# Patient Record
Sex: Female | Born: 1980 | Race: Black or African American | Hispanic: No | Marital: Single | Smoking: Never smoker
Health system: Southern US, Community
[De-identification: ages and names within clinical notes are randomized; demographics above are authoritative.]

## PROBLEM LIST (undated history)

## (undated) DIAGNOSIS — I10 Essential (primary) hypertension: Secondary | ICD-10-CM

## (undated) DIAGNOSIS — G43909 Migraine, unspecified, not intractable, without status migrainosus: Secondary | ICD-10-CM

## (undated) DIAGNOSIS — A4902 Methicillin resistant Staphylococcus aureus infection, unspecified site: Secondary | ICD-10-CM

## (undated) DIAGNOSIS — K209 Esophagitis, unspecified without bleeding: Secondary | ICD-10-CM

## (undated) DIAGNOSIS — C801 Malignant (primary) neoplasm, unspecified: Secondary | ICD-10-CM

## (undated) DIAGNOSIS — J189 Pneumonia, unspecified organism: Secondary | ICD-10-CM

## (undated) DIAGNOSIS — G8929 Other chronic pain: Secondary | ICD-10-CM

## (undated) DIAGNOSIS — R079 Chest pain, unspecified: Secondary | ICD-10-CM

## (undated) DIAGNOSIS — D649 Anemia, unspecified: Secondary | ICD-10-CM

## (undated) DIAGNOSIS — N289 Disorder of kidney and ureter, unspecified: Secondary | ICD-10-CM

## (undated) HISTORY — PX: MASTECTOMY: SHX3

## (undated) HISTORY — PX: CHOLECYSTECTOMY: SHX55

---

## 2006-10-23 ENCOUNTER — Emergency Department (HOSPITAL_COMMUNITY): Admission: EM | Admit: 2006-10-23 | Discharge: 2006-10-23 | Payer: Self-pay | Admitting: Emergency Medicine

## 2008-12-10 ENCOUNTER — Emergency Department (HOSPITAL_BASED_OUTPATIENT_CLINIC_OR_DEPARTMENT_OTHER): Admission: EM | Admit: 2008-12-10 | Discharge: 2008-12-10 | Payer: Self-pay | Admitting: Emergency Medicine

## 2008-12-12 ENCOUNTER — Emergency Department (HOSPITAL_BASED_OUTPATIENT_CLINIC_OR_DEPARTMENT_OTHER): Admission: EM | Admit: 2008-12-12 | Discharge: 2008-12-13 | Payer: Self-pay | Admitting: Emergency Medicine

## 2008-12-26 ENCOUNTER — Emergency Department (HOSPITAL_BASED_OUTPATIENT_CLINIC_OR_DEPARTMENT_OTHER): Admission: EM | Admit: 2008-12-26 | Discharge: 2008-12-26 | Payer: Self-pay | Admitting: Emergency Medicine

## 2008-12-26 ENCOUNTER — Ambulatory Visit: Payer: Self-pay | Admitting: Diagnostic Radiology

## 2009-05-20 ENCOUNTER — Emergency Department (HOSPITAL_BASED_OUTPATIENT_CLINIC_OR_DEPARTMENT_OTHER): Admission: EM | Admit: 2009-05-20 | Discharge: 2009-05-20 | Payer: Self-pay | Admitting: Emergency Medicine

## 2009-09-21 ENCOUNTER — Emergency Department (HOSPITAL_BASED_OUTPATIENT_CLINIC_OR_DEPARTMENT_OTHER): Admission: EM | Admit: 2009-09-21 | Discharge: 2009-09-21 | Payer: Self-pay | Admitting: Emergency Medicine

## 2009-12-23 ENCOUNTER — Ambulatory Visit: Payer: Self-pay | Admitting: Obstetrics and Gynecology

## 2010-06-27 LAB — URINALYSIS, ROUTINE W REFLEX MICROSCOPIC
Bilirubin Urine: NEGATIVE
Glucose, UA: NEGATIVE mg/dL
Hgb urine dipstick: NEGATIVE
Nitrite: NEGATIVE
Specific Gravity, Urine: 1.005 (ref 1.005–1.030)
pH: 6 (ref 5.0–8.0)

## 2010-06-27 LAB — URINE MICROSCOPIC-ADD ON

## 2010-07-15 LAB — DIFFERENTIAL
Basophils Absolute: 0.1 10*3/uL (ref 0.0–0.1)
Basophils Absolute: 0.2 10*3/uL — ABNORMAL HIGH (ref 0.0–0.1)
Basophils Relative: 2 % — ABNORMAL HIGH (ref 0–1)
Basophils Relative: 3 % — ABNORMAL HIGH (ref 0–1)
Eosinophils Absolute: 0.1 10*3/uL (ref 0.0–0.7)
Eosinophils Relative: 1 % (ref 0–5)
Lymphocytes Relative: 19 % (ref 12–46)
Lymphocytes Relative: 25 % (ref 12–46)
Lymphocytes Relative: 31 % (ref 12–46)
Lymphs Abs: 1.8 10*3/uL (ref 0.7–4.0)
Monocytes Absolute: 0.7 10*3/uL (ref 0.1–1.0)
Monocytes Relative: 12 % (ref 3–12)
Neutro Abs: 5.7 10*3/uL (ref 1.7–7.7)
Neutro Abs: 3.1 10*3/uL (ref 1.7–7.7)

## 2010-07-15 LAB — BASIC METABOLIC PANEL
BUN: 2 mg/dL — ABNORMAL LOW (ref 6–23)
CO2: 25 mEq/L (ref 19–32)
GFR calc non Af Amer: >60 mL/min (ref >60)
Glucose, Bld: 86 mg/dL (ref 70–99)
Potassium: 3.4 mEq/L — ABNORMAL LOW (ref 3.5–5.1)

## 2010-07-15 LAB — URINALYSIS, ROUTINE W REFLEX MICROSCOPIC
Glucose, UA: NEGATIVE mg/dL
Glucose, UA: NEGATIVE mg/dL
Hgb urine dipstick: NEGATIVE
Hgb urine dipstick: NEGATIVE
Nitrite: NEGATIVE
Protein, ur: NEGATIVE mg/dL
Specific Gravity, Urine: 1.015 (ref 1.005–1.030)
Specific Gravity, Urine: 1.023 (ref 1.005–1.030)
Urobilinogen, UA: 1 mg/dL (ref 0.0–1.0)
pH: 7 (ref 5.0–8.0)

## 2010-07-15 LAB — CBC
HCT: 33.9 % — ABNORMAL LOW (ref 36.0–46.0)
HCT: 34.6 % — ABNORMAL LOW (ref 36.0–46.0)
Hemoglobin: 11.2 g/dL — ABNORMAL LOW (ref 12.0–15.0)
MCHC: 34.2 g/dL (ref 30.0–36.0)
Platelets: 215 10*3/uL (ref 150–400)
Platelets: 179 10*3/uL (ref 150–400)
Platelets: 222 10*3/uL (ref 150–400)
RDW: 14.3 % (ref 11.5–15.5)
RDW: 14.5 % (ref 11.5–15.5)
RDW: 14.6 % (ref 11.5–15.5)

## 2010-07-15 LAB — COMPREHENSIVE METABOLIC PANEL
Albumin: 3.9 g/dL (ref 3.5–5.2)
Alkaline Phosphatase: 66 U/L (ref 39–117)
BUN: 5 mg/dL — ABNORMAL LOW (ref 6–23)
Calcium: 9.1 mg/dL (ref 8.4–10.5)
Potassium: 3.7 mEq/L (ref 3.5–5.1)
Sodium: 139 mEq/L (ref 135–145)
Total Protein: 7.8 g/dL (ref 6.0–8.3)

## 2010-07-15 LAB — GC/CHLAMYDIA PROBE AMP, GENITAL: GC Probe Amp, Genital: NEGATIVE

## 2010-07-15 LAB — URINE MICROSCOPIC-ADD ON

## 2010-07-15 LAB — URINE CULTURE
Colony Count: NO GROWTH
Culture: NO GROWTH
Culture: NO GROWTH

## 2010-07-15 LAB — HCG, QUANTITATIVE, PREGNANCY: hCG, Beta Chain, Quant, S: 3141 m[IU]/mL — ABNORMAL HIGH (ref <5)

## 2010-07-15 LAB — WET PREP, GENITAL
Trich, Wet Prep: NONE SEEN
Yeast Wet Prep HPF POC: NONE SEEN

## 2011-12-23 ENCOUNTER — Emergency Department (HOSPITAL_BASED_OUTPATIENT_CLINIC_OR_DEPARTMENT_OTHER)
Admission: EM | Admit: 2011-12-23 | Discharge: 2011-12-23 | Disposition: A | Discharge status: Home / Self Care | Payer: Medicare Other | Attending: Emergency Medicine | Admitting: Emergency Medicine

## 2011-12-23 ENCOUNTER — Encounter (HOSPITAL_BASED_OUTPATIENT_CLINIC_OR_DEPARTMENT_OTHER): Payer: Self-pay | Admitting: *Deleted

## 2011-12-23 DIAGNOSIS — G43909 Migraine, unspecified, not intractable, without status migrainosus: Secondary | ICD-10-CM

## 2011-12-23 HISTORY — DX: Migraine, unspecified, not intractable, without status migrainosus: G43.909

## 2011-12-23 LAB — URINALYSIS, ROUTINE W REFLEX MICROSCOPIC
Bilirubin Urine: NEGATIVE
Ketones, ur: NEGATIVE mg/dL
Leukocytes, UA: NEGATIVE
Nitrite: NEGATIVE
Protein, ur: NEGATIVE mg/dL
Urobilinogen, UA: 1 mg/dL (ref 0.0–1.0)
pH: 6.5 (ref 5.0–8.0)

## 2011-12-23 MED ORDER — DIPHENHYDRAMINE HCL 50 MG/ML IJ SOLN
25.0000 mg | Freq: Once | INTRAMUSCULAR | Status: AC
  Administered 2011-12-23: 25 mg via INTRAVENOUS
  Filled 2011-12-23: qty 1

## 2011-12-23 MED ORDER — SODIUM CHLORIDE 0.9 % IV SOLN: INTRAVENOUS | Status: DC

## 2011-12-23 MED ORDER — SODIUM CHLORIDE 0.9 % IV BOLUS (SEPSIS)
1000.0000 mL | Freq: Once | INTRAVENOUS | Status: AC
  Administered 2011-12-23: 1000 mL via INTRAVENOUS

## 2011-12-23 MED ORDER — PROMETHAZINE HCL 25 MG/ML IJ SOLN
12.5000 mg | Freq: Once | INTRAMUSCULAR | Status: AC
  Administered 2011-12-23: 12.5 mg via INTRAVENOUS
  Filled 2011-12-23: qty 1

## 2011-12-23 MED ORDER — DEXAMETHASONE SODIUM PHOSPHATE 10 MG/ML IJ SOLN
10.0000 mg | Freq: Once | INTRAMUSCULAR | Status: AC
  Administered 2011-12-23: 10 mg via INTRAVENOUS
  Filled 2011-12-23: qty 1

## 2011-12-23 MED ORDER — HYDROMORPHONE HCL PF 1 MG/ML IJ SOLN
1.0000 mg | Freq: Once | INTRAMUSCULAR | Status: AC
  Administered 2011-12-23: 1 mg via INTRAVENOUS
  Filled 2011-12-23: qty 1

## 2011-12-23 NOTE — ED Notes (Signed)
Migraine x 3 days. Also c/o body aches and sore throat since yesterday

## 2011-12-23 NOTE — ED Provider Notes (Signed)
History     CSN: [id number]  Arrival date & time 12/23/11  1321   First MD Initiated Contact with Patient 12/23/11 1330      Chief Complaint  Patient presents with  . Migraine    (Consider location/radiation/quality/duration/timing/severity/associated sxs/prior treatment) Patient is a 31 y.o. female presenting with migraines. The history is provided by the patient.  Migraine This is a new problem. The current episode started more than 2 days ago. The problem occurs constantly. The problem has been gradually worsening. Associated symptoms include headaches. Pertinent negatives include no chest pain, no abdominal pain and no shortness of breath. Nothing aggravates the symptoms. Nothing relieves the symptoms.   Patient with history of migraines this migraine is typical for being predominantly right-sided around her right eye. And progressively getting worse over the past 3 days preterm is 10 out of 10. No significant nausea or vomiting associated with no focal deficits. Does have some mild photophobia. Pain is nonradiating. Patient also has some bodyaches and sore throat that started yesterday no nasal congestion. No fever.  Past Medical History  Diagnosis Date  . Migraines     Past Surgical History  Procedure Date  . Cholecystectomy     History reviewed. No pertinent family history.  History  Substance Use Topics  . Smoking status: Never Smoker   . Smokeless tobacco: Not on file  . Alcohol Use: No    OB History    Grav Para Term Preterm Abortions TAB SAB Ect Mult Living                  Review of Systems  Constitutional: Negative for fever.  HENT: Positive for sore throat. Negative for congestion and neck pain.   Eyes: Positive for pain.  Respiratory: Negative for shortness of breath.   Cardiovascular: Negative for chest pain.  Gastrointestinal: Negative for nausea, vomiting and abdominal pain.  Genitourinary: Negative for dysuria.  Musculoskeletal: Negative for  back pain.  Skin: Negative for rash.  Neurological: Positive for headaches.  Hematological: Does not bruise/bleed easily.    Allergies  Percocet  Home Medications  No current outpatient prescriptions on file.  BP 139/94  Pulse 94  Temp 97.5 F (36.4 C) (Oral)  Resp 18  Ht 5\' 7"  (1.702 m)  Wt 220 lb (99.791 kg)  BMI 34.46 kg/m2  SpO2 98%  Physical Exam  Nursing note and vitals reviewed. Constitutional: She is oriented to person, place, and time. She appears well-developed and well-nourished.  HENT:  Head: Normocephalic and atraumatic.  Mouth/Throat: Oropharynx is clear and moist.  Eyes: Conjunctivae normal and EOM are normal. Pupils are equal, round, and reactive to light.  Neck: Normal range of motion. Neck supple.  Cardiovascular: Normal rate, regular rhythm and normal heart sounds.   Pulmonary/Chest: Effort normal and breath sounds normal.  Abdominal: Soft. Bowel sounds are normal.  Musculoskeletal: Normal range of motion.  Neurological: She is alert and oriented to person, place, and time. No cranial nerve deficit. She exhibits normal muscle tone. Coordination normal.  Skin: Skin is warm. No rash noted.    ED Course  Procedures (including critical care time)   Labs Reviewed  URINALYSIS, ROUTINE W REFLEX MICROSCOPIC  PREGNANCY, URINE   No results found.   1. Migraine       MDM  Patient with history of migraines in the past. This migraine is typical for her headaches it is predominantly right-sided around the right eye. Been going on for 3 days so may be  exacerbated probably what sounds like a viral upper respiratory infection.  Patient given now migraine cocktail which included Decadron Benadryl and Phenergan and just a touch of the hydromorphone headache is significantly improved along with 1 L of IV fluids. Patient had been on a chronic medicine she doesn't know the name of it tried to look up in the system to see she been seen before even under a  different name with effusion to spell her name was unable to find any previous visit for her date of birth and name. Patient is comfortable and can be discharged home.        Shelda Jakes, MD 12/23/11 [phone]

## 2012-01-20 ENCOUNTER — Emergency Department (HOSPITAL_BASED_OUTPATIENT_CLINIC_OR_DEPARTMENT_OTHER)
Admission: EM | Admit: 2012-01-20 | Discharge: 2012-01-20 | Disposition: A | Discharge status: Home / Self Care | Payer: Medicare Other | Attending: Emergency Medicine | Admitting: Emergency Medicine

## 2012-01-20 ENCOUNTER — Encounter (HOSPITAL_BASED_OUTPATIENT_CLINIC_OR_DEPARTMENT_OTHER): Payer: Self-pay | Admitting: *Deleted

## 2012-01-20 DIAGNOSIS — R51 Headache: Secondary | ICD-10-CM

## 2012-01-20 DIAGNOSIS — G43909 Migraine, unspecified, not intractable, without status migrainosus: Secondary | ICD-10-CM | POA: Insufficient documentation

## 2012-01-20 DIAGNOSIS — Z885 Allergy status to narcotic agent status: Secondary | ICD-10-CM | POA: Insufficient documentation

## 2012-01-20 MED ORDER — SODIUM CHLORIDE 0.9 % IV SOLN: 1000.0000 mL | INTRAVENOUS | Status: DC

## 2012-01-20 MED ORDER — KETOROLAC TROMETHAMINE 30 MG/ML IJ SOLN
30.0000 mg | Freq: Once | INTRAMUSCULAR | Status: AC
  Administered 2012-01-20: 30 mg via INTRAVENOUS
  Filled 2012-01-20: qty 1

## 2012-01-20 MED ORDER — DIPHENHYDRAMINE HCL 50 MG/ML IJ SOLN
25.0000 mg | Freq: Once | INTRAMUSCULAR | Status: AC
  Administered 2012-01-20: 25 mg via INTRAVENOUS
  Filled 2012-01-20: qty 1

## 2012-01-20 MED ORDER — METOCLOPRAMIDE HCL 5 MG/ML IJ SOLN
20.0000 mg | Freq: Once | INTRAVENOUS | Status: AC
  Administered 2012-01-20: 20 mg via INTRAVENOUS
  Filled 2012-01-20: qty 2

## 2012-01-20 MED ORDER — BUTALBITAL-APAP-CAFFEINE 50-325-40 MG PO TABS: 1.0000 | ORAL_TABLET | Freq: Four times a day (QID) | ORAL | Status: DC | PRN

## 2012-01-20 MED ORDER — SODIUM CHLORIDE 0.9 % IV SOLN
1000.0000 mL | Freq: Once | INTRAVENOUS | Status: AC
  Administered 2012-01-20: 1000 mL via INTRAVENOUS

## 2012-01-20 NOTE — ED Notes (Signed)
Pt c/o headache with nausea since 3pm on Friday, no relief from aleve. Alert and oriented x4 with a steady gait.

## 2012-01-20 NOTE — ED Provider Notes (Signed)
History     CSN: [id number]  Arrival date & time 01/20/12  0107   First MD Initiated Contact with Patient 01/20/12 0341      Chief Complaint  Patient presents with  . Headache    (Consider location/radiation/quality/duration/timing/severity/associated sxs/prior treatment) HPI The patient presents with a headache.  She notes that he began insidiously approximately 2 days ago.  Since onset the pain has been worsening.  The pain is right sided, posterior orbital, with minimal radiation.  Pain is throbbing, with associated photophobia.  Minimal relief with OTC medication. The patient has a long history of migraines, notes that this episode is characteristically the same as in numerable prior episodes.  She notes associated nausea, no vomiting, no behavioral changes, no ataxia, no fevers, chills, any other focal complaints. Past Medical History  Diagnosis Date  . Migraines     Past Surgical History  Procedure Date  . Cholecystectomy     History reviewed. No pertinent family history.  History  Substance Use Topics  . Smoking status: Never Smoker   . Smokeless tobacco: Not on file  . Alcohol Use: No    OB History    Grav Para Term Preterm Abortions TAB SAB Ect Mult Living                  Review of Systems  Constitutional:       HPI  HENT:       HPI otherwise negative  Eyes: Negative.   Respiratory:       HPI, otherwise negative  Cardiovascular:       HPI, otherwise nmegative  Gastrointestinal: Negative for vomiting.  Genitourinary:       HPI, otherwise negative  Musculoskeletal:       HPI, otherwise negative  Skin: Negative.   Neurological: Positive for headaches. Negative for syncope.    Allergies  Percocet  Home Medications   Current Outpatient Rx  Name Route Sig Dispense Refill  . IBUPROFEN-DIPHENHYDRAMINE CIT 200-38 MG PO TABS Oral Take 2 tablets by mouth daily as needed. For sleep      BP 140/100  Pulse 94  Temp 98.6 F (37 C) (Oral)  Resp  18  Ht 5\' 7"  (1.702 m)  Wt 200 lb (90.719 kg)  BMI 31.32 kg/m2  SpO2 100%  Physical Exam  Nursing note and vitals reviewed. Constitutional: She is oriented to person, place, and time. She appears well-developed and well-nourished. No distress.  HENT:  Head: Normocephalic and atraumatic.  Eyes: Conjunctivae normal and EOM are normal.  Cardiovascular: Normal rate and regular rhythm.   Pulmonary/Chest: Effort normal and breath sounds normal. No stridor. No respiratory distress.  Abdominal: She exhibits no distension.  Musculoskeletal: She exhibits no edema.  Neurological: She is alert and oriented to person, place, and time. No cranial nerve deficit. She exhibits normal muscle tone. Coordination normal.  Skin: Skin is warm and dry.  Psychiatric: She has a normal mood and affect.    ED Course  Procedures (including critical care time)  Labs Reviewed - No data to display No results found.   No diagnosis found.    MDM  This patient with history of migraine headaches now presents with characteristically typical headache for her.  On my exam the patient is uncomfortable appearing, but in no distress with unremarkable vital signs.  The patient's neurologic deficiencies, and given her description of this pain as being the same as multiple prior episodes there is low suspicion for acute new pathology.  The patient had significant improvement with analgesics, and was discharged in stable condition with PMD followup.    Gerhard Munch, MD 01/20/12 [phone]

## 2012-05-06 ENCOUNTER — Emergency Department (HOSPITAL_BASED_OUTPATIENT_CLINIC_OR_DEPARTMENT_OTHER)
Admission: EM | Admit: 2012-05-06 | Discharge: 2012-05-06 | Disposition: A | Discharge status: Home / Self Care | Payer: Medicare Other | Attending: Emergency Medicine | Admitting: Emergency Medicine

## 2012-05-06 ENCOUNTER — Encounter (HOSPITAL_BASED_OUTPATIENT_CLINIC_OR_DEPARTMENT_OTHER): Payer: Self-pay | Admitting: *Deleted

## 2012-05-06 DIAGNOSIS — G43909 Migraine, unspecified, not intractable, without status migrainosus: Secondary | ICD-10-CM | POA: Insufficient documentation

## 2012-05-06 DIAGNOSIS — R112 Nausea with vomiting, unspecified: Secondary | ICD-10-CM | POA: Insufficient documentation

## 2012-05-06 LAB — URINALYSIS, ROUTINE W REFLEX MICROSCOPIC
Bilirubin Urine: NEGATIVE
Glucose, UA: NEGATIVE mg/dL
Ketones, ur: 15 mg/dL — AB
Protein, ur: 30 mg/dL — AB
Urobilinogen, UA: 2 mg/dL — ABNORMAL HIGH (ref 0.0–1.0)

## 2012-05-06 LAB — URINE MICROSCOPIC-ADD ON

## 2012-05-06 MED ORDER — KETOROLAC TROMETHAMINE 30 MG/ML IJ SOLN
30.0000 mg | Freq: Once | INTRAMUSCULAR | Status: AC
  Administered 2012-05-06: 30 mg via INTRAVENOUS
  Filled 2012-05-06: qty 1

## 2012-05-06 MED ORDER — ONDANSETRON HCL 4 MG PO TABS: 4.0000 mg | ORAL_TABLET | Freq: Three times a day (TID) | ORAL | Status: DC | PRN

## 2012-05-06 MED ORDER — METOCLOPRAMIDE HCL 5 MG/ML IJ SOLN
10.0000 mg | Freq: Once | INTRAMUSCULAR | Status: AC
  Administered 2012-05-06: 10 mg via INTRAVENOUS
  Filled 2012-05-06: qty 2

## 2012-05-06 MED ORDER — DIPHENHYDRAMINE HCL 50 MG/ML IJ SOLN
25.0000 mg | Freq: Once | INTRAMUSCULAR | Status: AC
  Administered 2012-05-06: 25 mg via INTRAVENOUS
  Filled 2012-05-06: qty 1

## 2012-05-06 MED ORDER — SODIUM CHLORIDE 0.9 % IV BOLUS (SEPSIS)
1000.0000 mL | Freq: Once | INTRAVENOUS | Status: AC
  Administered 2012-05-06: 1000 mL via INTRAVENOUS

## 2012-05-06 MED ORDER — BUTALBITAL-APAP-CAFFEINE 50-325-40 MG PO TABS: 1.0000 | ORAL_TABLET | Freq: Four times a day (QID) | ORAL | Status: AC | PRN

## 2012-05-06 NOTE — ED Notes (Signed)
Pt c/o n/v/d and c/o abd pain, chills and h/a x 2 days

## 2012-05-06 NOTE — ED Notes (Signed)
MD at bedside. 

## 2012-05-06 NOTE — ED Notes (Signed)
Pt states she is on period today also reports that she has been having problems with excessive bleeding therefore the doctor gave her depo shot early and "some pills" to stop the bleeding. States she started back couple days ago.

## 2012-05-06 NOTE — ED Provider Notes (Signed)
History  This chart was scribed for Loretta Chick, MD by Shari Heritage, ED Scribe. The patient was seen in room MH06/MH06. Patient's care was started at 1640.  CSN: [id number]  Arrival date & time 05/06/12  1627   First MD Initiated Contact with Patient 05/06/12 1640      Chief Complaint  Patient presents with  . Migraine    Patient is a 32 y.o. female presenting with migraines. The history is provided by the patient. No language interpreter was used.  Migraine This is a chronic problem. The current episode started more than 2 days ago. The problem occurs constantly. The problem has not changed since onset.Associated symptoms include headaches. Nothing relieves the symptoms. She has tried nothing for the symptoms.    HPI Comments: Kynnedi Zweig is a 32 y.o. female with history of migraines who presents to the Emergency Department complaining of moderate to severe, constant, non-radiating, throbbing, left-sided headache onset a few days ago. Patient also reports that she had a similar right-sided headache that began 1 week ago and lasted for 3 days that is now resolved. There is associated emesis, nausea, and photophobia. Patient states that emesis began last night. She also reports 1 episode of loose stool, but no diarrhea. She is intolerant of fluids and solids. Patient denies any other symptoms at this time.  Patient reports no other significant past medical history. Patient does not smoke.   Past Medical History  Diagnosis Date  . Migraines     Past Surgical History  Procedure Date  . Cholecystectomy     History reviewed. No pertinent family history.  History  Substance Use Topics  . Smoking status: Never Smoker   . Smokeless tobacco: Not on file  . Alcohol Use: No    OB History    Grav Para Term Preterm Abortions TAB SAB Ect Mult Living                  Review of Systems  Eyes: Positive for photophobia.  Gastrointestinal: Positive for nausea and vomiting.    Neurological: Positive for headaches.  All other systems reviewed and are negative.    Allergies  Percocet  Home Medications   Current Outpatient Rx  Name  Route  Sig  Dispense  Refill  . BUTALBITAL-APAP-CAFFEINE 50-325-40 MG PO TABS   Oral   Take 1 tablet by mouth every 6 (six) hours as needed for headache.   10 tablet   0   . BUTALBITAL-APAP-CAFFEINE 50-325-40 MG PO TABS   Oral   Take 1-2 tablets by mouth every 6 (six) hours as needed for headache.   20 tablet   0   . IBUPROFEN-DIPHENHYDRAMINE CIT 200-38 MG PO TABS   Oral   Take 2 tablets by mouth daily as needed. For sleep         . ONDANSETRON HCL 4 MG PO TABS   Oral   Take 1 tablet (4 mg total) by mouth every 8 (eight) hours as needed for nausea.   20 tablet   0     Triage Vitals: P 133/90  Pulse 86  Temp 98.5 F (36.9 C) (Oral)  Resp 16  Ht 5\' 7"  (1.702 m)  Wt 200 lb (90.719 kg)  BMI 31.32 kg/m2  SpO2 100%  Physical Exam  Nursing note and vitals reviewed. Constitutional: She is oriented to person, place, and time. She appears well-developed and well-nourished.       Uncomfortable appearing.  HENT:  Head: Normocephalic and  atraumatic.  Mouth/Throat: Oropharynx is clear and moist and mucous membranes are normal. Mucous membranes are not dry.  Eyes: Conjunctivae normal and EOM are normal. Pupils are equal, round, and reactive to light.  Neck: Normal range of motion. Neck supple.  Cardiovascular: Normal rate, regular rhythm and normal heart sounds.   Pulmonary/Chest: Effort normal and breath sounds normal.  Abdominal: Soft. Bowel sounds are normal.  Musculoskeletal: Normal range of motion.  Neurological: She is alert and oriented to person, place, and time.       Cranial nerves 2-12 tested and intact. Strength 5/5 in four extremities. Sensation intact.   Skin: Skin is warm and dry.  Psychiatric: She has a normal mood and affect.    ED Course  Procedures (including critical care time) DIAGNOSTIC  STUDIES: Oxygen Saturation is 100% on room air, normal by my interpretation.    COORDINATION OF CARE: 5:13 PM - Patient informed of current plan for treatment and evaluation and agrees with plan at this time.    6:33 PM pt states headache is improving.    Labs Reviewed  URINALYSIS, ROUTINE W REFLEX MICROSCOPIC - Abnormal; Notable for the following:    Color, Urine RED (*)  BIOCHEMICALS MAY BE AFFECTED BY COLOR   APPearance CLOUDY (*)     Hgb urine dipstick LARGE (*)     Ketones, ur 15 (*)     Protein, ur 30 (*)     Urobilinogen, UA 2.0 (*)     Leukocytes, UA SMALL (*)     All other components within normal limits  URINE MICROSCOPIC-ADD ON - Abnormal; Notable for the following:    Squamous Epithelial / LPF FEW (*)     All other components within normal limits  PREGNANCY, URINE    No results found.   1. Migraine headache   2. Nausea and vomiting       MDM  Pt presenting with c/o headache, she has a hx of migraines and this feels similar to her prior migraines.  She has also had vomiting.  Pt treated with IV meds in ED.  She has had no further vomiting and feels that her pain is improved.  Discharged with strict return precautions.  Pt agreeable with plan.   I personally performed the services described in this documentation, which was scribed in my presence. The recorded information has been reviewed and is accurate.    Loretta Chick, MD 05/06/12 [phone]

## 2012-08-15 ENCOUNTER — Emergency Department (HOSPITAL_BASED_OUTPATIENT_CLINIC_OR_DEPARTMENT_OTHER)
Admission: EM | Admit: 2012-08-15 | Discharge: 2012-08-15 | Disposition: A | Discharge status: Home / Self Care | Payer: Medicare Other | Attending: Emergency Medicine | Admitting: Emergency Medicine

## 2012-08-15 ENCOUNTER — Encounter (HOSPITAL_BASED_OUTPATIENT_CLINIC_OR_DEPARTMENT_OTHER): Payer: Self-pay | Admitting: *Deleted

## 2012-08-15 DIAGNOSIS — Z8679 Personal history of other diseases of the circulatory system: Secondary | ICD-10-CM | POA: Insufficient documentation

## 2012-08-15 DIAGNOSIS — E860 Dehydration: Secondary | ICD-10-CM

## 2012-08-15 DIAGNOSIS — R197 Diarrhea, unspecified: Secondary | ICD-10-CM | POA: Insufficient documentation

## 2012-08-15 DIAGNOSIS — E876 Hypokalemia: Secondary | ICD-10-CM | POA: Insufficient documentation

## 2012-08-15 DIAGNOSIS — Z3202 Encounter for pregnancy test, result negative: Secondary | ICD-10-CM | POA: Insufficient documentation

## 2012-08-15 LAB — COMPREHENSIVE METABOLIC PANEL
Albumin: 3.3 g/dL — ABNORMAL LOW (ref 3.5–5.2)
BUN: 4 mg/dL — ABNORMAL LOW (ref 6–23)
Calcium: 8.6 mg/dL (ref 8.4–10.5)
Creatinine, Ser: 0.7 mg/dL (ref 0.50–1.10)
Total Protein: 7 g/dL (ref 6.0–8.3)

## 2012-08-15 LAB — CBC WITH DIFFERENTIAL/PLATELET
Basophils Relative: 0 % (ref 0–1)
Eosinophils Absolute: 0.1 10*3/uL (ref 0.0–0.7)
HCT: 33.5 % — ABNORMAL LOW (ref 36.0–46.0)
Hemoglobin: 11.2 g/dL — ABNORMAL LOW (ref 12.0–15.0)
MCH: 31.7 pg (ref 26.0–34.0)
MCHC: 33.4 g/dL (ref 30.0–36.0)
Monocytes Absolute: 0.5 10*3/uL (ref 0.1–1.0)
Monocytes Relative: 15 % — ABNORMAL HIGH (ref 3–12)

## 2012-08-15 LAB — URINE MICROSCOPIC-ADD ON

## 2012-08-15 LAB — URINALYSIS, ROUTINE W REFLEX MICROSCOPIC
Bilirubin Urine: NEGATIVE
Leukocytes, UA: NEGATIVE
Nitrite: NEGATIVE
Specific Gravity, Urine: 1.022 (ref 1.005–1.030)
Urobilinogen, UA: 4 mg/dL — ABNORMAL HIGH (ref 0.0–1.0)
pH: 6.5 (ref 5.0–8.0)

## 2012-08-15 LAB — LIPASE, BLOOD: Lipase: 20 U/L (ref 11–59)

## 2012-08-15 MED ORDER — ONDANSETRON HCL 4 MG/2ML IJ SOLN
4.0000 mg | Freq: Once | INTRAMUSCULAR | Status: AC
  Administered 2012-08-15: 4 mg via INTRAVENOUS

## 2012-08-15 MED ORDER — ONDANSETRON HCL 4 MG/2ML IJ SOLN
INTRAMUSCULAR | Status: AC
  Filled 2012-08-15: qty 2

## 2012-08-15 MED ORDER — TRAMADOL HCL 50 MG PO TABS: 50.0000 mg | ORAL_TABLET | Freq: Four times a day (QID) | ORAL | Status: DC | PRN

## 2012-08-15 MED ORDER — SODIUM CHLORIDE 0.9 % IV BOLUS (SEPSIS)
1000.0000 mL | Freq: Once | INTRAVENOUS | Status: AC
  Administered 2012-08-15: 1000 mL via INTRAVENOUS

## 2012-08-15 MED ORDER — ONDANSETRON HCL 4 MG PO TABS: 4.0000 mg | ORAL_TABLET | Freq: Three times a day (TID) | ORAL | Status: DC | PRN

## 2012-08-15 MED ORDER — POTASSIUM CHLORIDE CRYS ER 20 MEQ PO TBCR
40.0000 meq | EXTENDED_RELEASE_TABLET | Freq: Once | ORAL | Status: AC
  Administered 2012-08-15: 40 meq via ORAL
  Filled 2012-08-15: qty 2

## 2012-08-15 MED ORDER — HYDROMORPHONE HCL PF 1 MG/ML IJ SOLN
1.0000 mg | Freq: Once | INTRAMUSCULAR | Status: AC
  Administered 2012-08-15: 1 mg via INTRAVENOUS
  Filled 2012-08-15: qty 1

## 2012-08-15 MED ORDER — ONDANSETRON HCL 4 MG/2ML IJ SOLN
4.0000 mg | Freq: Once | INTRAMUSCULAR | Status: AC
  Administered 2012-08-15: 4 mg via INTRAVENOUS
  Filled 2012-08-15: qty 2

## 2012-08-15 NOTE — ED Provider Notes (Signed)
History     CSN: [id number]  Arrival date & time 08/15/12  1052   First MD Initiated Contact with Patient 08/15/12 1117      Chief Complaint  Patient presents with  . Emesis  . Diarrhea    (Consider location/radiation/quality/duration/timing/severity/associated sxs/prior treatment) Patient is a 32 y.o. female presenting with vomiting and diarrhea.  Emesis Associated symptoms: diarrhea   Diarrhea Associated symptoms: vomiting    Pt with history of migraines reports multiple episodes of vomiting and diarrhea since last night, associated with diffuse throbbing headache and abdominal soreness. Denies fever. No blood in emesis or stool. No sick contacts.   Past Medical History  Diagnosis Date  . Migraines     Past Surgical History  Procedure Laterality Date  . Cholecystectomy      History reviewed. No pertinent family history.  History  Substance Use Topics  . Smoking status: Never Smoker   . Smokeless tobacco: Not on file  . Alcohol Use: No    OB History   Grav Para Term Preterm Abortions TAB SAB Ect Mult Living                  Review of Systems  Gastrointestinal: Positive for vomiting and diarrhea.   All other systems reviewed and are negative except as noted in HPI.   Allergies  Percocet  Home Medications   Current Outpatient Rx  Name  Route  Sig  Dispense  Refill  . butalbital-acetaminophen-caffeine (FIORICET) 50-325-40 MG per tablet   Oral   Take 1 tablet by mouth every 6 (six) hours as needed for headache.   10 tablet   0   . butalbital-acetaminophen-caffeine (FIORICET) 50-325-40 MG per tablet   Oral   Take 1-2 tablets by mouth every 6 (six) hours as needed for headache.   20 tablet   0   . Ibuprofen-Diphenhydramine Cit (ADVIL PM) 200-38 MG TABS   Oral   Take 2 tablets by mouth daily as needed. For sleep         . ondansetron (ZOFRAN) 4 MG tablet   Oral   Take 1 tablet (4 mg total) by mouth every 8 (eight) hours as needed for nausea.   20 tablet   0     BP 152/100  Pulse 98  Temp(Src) 98.3 F (36.8 C) (Oral)  Resp 16  Ht 5\' 7"  (1.702 m)  Wt 200 lb (90.719 kg)  BMI 31.32 kg/m2  SpO2 100%  Physical Exam  Nursing note and vitals reviewed. Constitutional: She is oriented to person, place, and time. She appears well-developed and well-nourished.  HENT:  Head: Normocephalic and atraumatic.  Eyes: EOM are normal. Pupils are equal, round, and reactive to light.  Neck: Normal range of motion. Neck supple.  Cardiovascular: Normal rate, normal heart sounds and intact distal pulses.   Pulmonary/Chest: Effort normal and breath sounds normal.  Abdominal: Bowel sounds are normal. She exhibits no distension. There is tenderness (diffuse mild). There is no rebound and no guarding.  Musculoskeletal: Normal range of motion. She exhibits no edema and no tenderness.  Neurological: She is alert and oriented to person, place, and time. She has normal strength. No cranial nerve deficit or sensory deficit.  Skin: Skin is warm and dry. No rash noted.  Psychiatric: She has a normal mood and affect.    ED Course  Procedures (including critical care time)  Labs Reviewed  URINALYSIS, ROUTINE W REFLEX MICROSCOPIC - Abnormal; Notable for the following:    Protein,  ur 30 (*)    Urobilinogen, UA 4.0 (*)    All other components within normal limits  URINE MICROSCOPIC-ADD ON - Abnormal; Notable for the following:    Bacteria, UA FEW (*)    All other components within normal limits  CBC WITH DIFFERENTIAL - Abnormal; Notable for the following:    WBC 3.3 (*)    RBC 3.53 (*)    Hemoglobin 11.2 (*)    HCT 33.5 (*)    Platelets 131 (*)    Neutro Abs 1.4 (*)    Monocytes Relative 15 (*)    All other components within normal limits  COMPREHENSIVE METABOLIC PANEL - Abnormal; Notable for the following:    Potassium 2.9 (*)    Glucose, Bld 101 (*)    BUN 4 (*)    Albumin 3.3 (*)    All other components within normal limits  PREGNANCY,  URINE  LIPASE, BLOOD   No results found.   1. Vomiting and diarrhea   2. Hypokalemia   3. Dehydration       MDM  Labs reviewed, K replaced. Pt given second round of IVF and meds due to continued vomiting but feeling much better now, tolerating PO fluids and ready to go home.        Malak Duchesneau B. Bernette Mayers, MD 08/15/12 1324

## 2012-08-15 NOTE — ED Notes (Signed)
Pt c/o n/v/d x 2 days 

## 2013-06-02 ENCOUNTER — Encounter (HOSPITAL_BASED_OUTPATIENT_CLINIC_OR_DEPARTMENT_OTHER): Payer: Self-pay | Admitting: Emergency Medicine

## 2013-06-02 ENCOUNTER — Emergency Department (HOSPITAL_BASED_OUTPATIENT_CLINIC_OR_DEPARTMENT_OTHER)
Admission: EM | Admit: 2013-06-02 | Discharge: 2013-06-02 | Disposition: A | Discharge status: Home / Self Care | Payer: Medicare Other | Attending: Emergency Medicine | Admitting: Emergency Medicine

## 2013-06-02 DIAGNOSIS — R112 Nausea with vomiting, unspecified: Secondary | ICD-10-CM | POA: Insufficient documentation

## 2013-06-02 DIAGNOSIS — H5710 Ocular pain, unspecified eye: Secondary | ICD-10-CM | POA: Insufficient documentation

## 2013-06-02 DIAGNOSIS — H53149 Visual discomfort, unspecified: Secondary | ICD-10-CM | POA: Insufficient documentation

## 2013-06-02 DIAGNOSIS — R519 Headache, unspecified: Secondary | ICD-10-CM

## 2013-06-02 DIAGNOSIS — R51 Headache: Secondary | ICD-10-CM | POA: Insufficient documentation

## 2013-06-02 MED ORDER — DIPHENHYDRAMINE HCL 50 MG/ML IJ SOLN
25.0000 mg | Freq: Once | INTRAMUSCULAR | Status: AC
  Administered 2013-06-02: 25 mg via INTRAVENOUS
  Filled 2013-06-02: qty 1

## 2013-06-02 MED ORDER — SODIUM CHLORIDE 0.9 % IV BOLUS (SEPSIS)
1000.0000 mL | Freq: Once | INTRAVENOUS | Status: AC
  Administered 2013-06-02: 1000 mL via INTRAVENOUS

## 2013-06-02 MED ORDER — METOCLOPRAMIDE HCL 5 MG/ML IJ SOLN
10.0000 mg | Freq: Once | INTRAMUSCULAR | Status: AC
  Administered 2013-06-02: 10 mg via INTRAVENOUS
  Filled 2013-06-02: qty 2

## 2013-06-02 MED ORDER — METOCLOPRAMIDE HCL 5 MG/ML IJ SOLN
10.0000 mg | Freq: Once | INTRAMUSCULAR | Status: AC
  Administered 2013-06-02: 10 mg via INTRAVENOUS

## 2013-06-02 MED ORDER — KETOROLAC TROMETHAMINE 30 MG/ML IJ SOLN
30.0000 mg | Freq: Once | INTRAMUSCULAR | Status: AC
  Administered 2013-06-02: 30 mg via INTRAVENOUS
  Filled 2013-06-02: qty 1

## 2013-06-02 MED ORDER — METOCLOPRAMIDE HCL 5 MG/ML IJ SOLN
INTRAMUSCULAR | Status: AC
  Filled 2013-06-02: qty 2

## 2013-06-02 NOTE — Discharge Instructions (Signed)
As discussed, it is important that you follow up as soon as possible with your physician for continued management of your condition. ° °If you develop any new, or concerning changes in your condition, please return to the emergency department immediately. ° °

## 2013-06-02 NOTE — ED Provider Notes (Signed)
CSN: [id number]     Arrival date & time 06/02/13  1723 History  This chart was scribed for Carmin Muskrat, MD by Adriana Reams, ED Scribe. This patient was seen in room MH07/MH07 and the patient's care was started at 1934.   First MD Initiated Contact with Patient 06/02/13 1934     Chief Complaint  Patient presents with  . Migraine      The history is provided by the patient. No language interpreter was used.   HPI Comments: Loretta Khan is a 33 y.o. female who presents to the Emergency Department complaining of 2 days of gradual onset, gradually worsening frontal migraine. She reports a hx of migraines, and this feels similar to prior episodes. She reports associated right eye pain, nausea, vomiting and photophobia. She has tried Aleve with no relief. She denies fever, CP, syncope, difficulty breathing, neck pain, neck tightness or any other symptoms. She states she is otherwise healthy. She has an appointment with her PCP this week to get back on her preventative migraine medication.   Past Medical History  Diagnosis Date  . Migraines    Past Surgical History  Procedure Laterality Date  . Cholecystectomy     No family history on file. History  Substance Use Topics  . Smoking status: Never Smoker   . Smokeless tobacco: Not on file  . Alcohol Use: No   OB History   Grav Para Term Preterm Abortions TAB SAB Ect Mult Living                 Review of Systems  Constitutional:       Per HPI, otherwise negative  HENT:       Per HPI, otherwise negative  Respiratory:       Per HPI, otherwise negative  Cardiovascular:       Per HPI, otherwise negative  Gastrointestinal: Negative for vomiting.  Endocrine:       Negative aside from HPI  Genitourinary:       Neg aside from HPI   Musculoskeletal:       Per HPI, otherwise negative  Skin: Negative.   Neurological: Negative for syncope.      Allergies  Percocet  Home Medications   Current Outpatient Rx  Name  Route   Sig  Dispense  Refill  . Ibuprofen-Diphenhydramine Cit (ADVIL PM) 200-38 MG TABS   Oral   Take 2 tablets by mouth daily as needed. For sleep         . ondansetron (ZOFRAN) 4 MG tablet   Oral   Take 1 tablet (4 mg total) by mouth every 8 (eight) hours as needed for nausea.   20 tablet   0   . ondansetron (ZOFRAN) 4 MG tablet   Oral   Take 1 tablet (4 mg total) by mouth every 8 (eight) hours as needed for nausea.   12 tablet   0   . traMADol (ULTRAM) 50 MG tablet   Oral   Take 1 tablet (50 mg total) by mouth every 6 (six) hours as needed for pain.   15 tablet   0    BP 129/92  Pulse 95  Temp(Src) 98.9 F (37.2 C) (Oral)  Resp 18  Ht 5\' 7"  (1.702 m)  Wt 200 lb (90.719 kg)  BMI 31.32 kg/m2  SpO2 97%  LMP 05/19/2013  Physical Exam  Nursing note and vitals reviewed. Constitutional: She is oriented to person, place, and time. She appears well-developed and well-nourished. No distress.  HENT:  Head: Normocephalic and atraumatic.  Eyes: Conjunctivae and EOM are normal.  Cardiovascular: Normal rate, regular rhythm and normal heart sounds.   Pulmonary/Chest: Effort normal and breath sounds normal. No stridor. No respiratory distress.  Abdominal: She exhibits no distension.  Musculoskeletal: She exhibits no edema.  Neurological: She is alert and oriented to person, place, and time. No cranial nerve deficit.  Skin: Skin is warm and dry.  Psychiatric: She has a normal mood and affect.    ED Course  Procedures (including critical care time) COORDINATION OF CARE: 8:02 PM Discussed treatment plan which includes IV fluids, 10 mg Reglan injection, 25 mg Benadryl injection, and 30 mg Toradol injection with pt at bedside and pt agreed to plan.   10:07 PM Pain resolved.  F/U instructions and return precautions discussed  MDM   Final diagnoses:  Headache     I personally performed the services described in this documentation, which was scribed in my presence. The recorded  information has been reviewed and is accurate.  Patient presents with typical headache, no red flanks, no neurologic findings, vital signs are stable.  Patient's pain resolved entirely.    Carmin Muskrat, MD 06/02/13 2208

## 2013-06-02 NOTE — ED Notes (Signed)
Pt c/o migraine x 2 days. sts it feels like her normal. Out of meds.

## 2013-06-02 NOTE — ED Notes (Signed)
Ha x 2.5 days w n/v  Is out of her meds

## 2013-09-03 ENCOUNTER — Encounter (HOSPITAL_BASED_OUTPATIENT_CLINIC_OR_DEPARTMENT_OTHER): Payer: Self-pay | Admitting: Emergency Medicine

## 2013-09-03 ENCOUNTER — Emergency Department (HOSPITAL_BASED_OUTPATIENT_CLINIC_OR_DEPARTMENT_OTHER)
Admission: EM | Admit: 2013-09-03 | Discharge: 2013-09-03 | Disposition: A | Discharge status: Home / Self Care | Payer: No Typology Code available for payment source | Attending: Emergency Medicine | Admitting: Emergency Medicine

## 2013-09-03 ENCOUNTER — Emergency Department (HOSPITAL_BASED_OUTPATIENT_CLINIC_OR_DEPARTMENT_OTHER): Payer: No Typology Code available for payment source

## 2013-09-03 DIAGNOSIS — Y92410 Unspecified street and highway as the place of occurrence of the external cause: Secondary | ICD-10-CM | POA: Insufficient documentation

## 2013-09-03 DIAGNOSIS — S20219A Contusion of unspecified front wall of thorax, initial encounter: Secondary | ICD-10-CM

## 2013-09-03 DIAGNOSIS — Z8679 Personal history of other diseases of the circulatory system: Secondary | ICD-10-CM | POA: Diagnosis not present

## 2013-09-03 DIAGNOSIS — S335XXA Sprain of ligaments of lumbar spine, initial encounter: Secondary | ICD-10-CM | POA: Insufficient documentation

## 2013-09-03 DIAGNOSIS — Y9389 Activity, other specified: Secondary | ICD-10-CM | POA: Insufficient documentation

## 2013-09-03 DIAGNOSIS — S0990XA Unspecified injury of head, initial encounter: Secondary | ICD-10-CM | POA: Diagnosis not present

## 2013-09-03 DIAGNOSIS — IMO0002 Reserved for concepts with insufficient information to code with codable children: Secondary | ICD-10-CM | POA: Diagnosis present

## 2013-09-03 DIAGNOSIS — S39012A Strain of muscle, fascia and tendon of lower back, initial encounter: Secondary | ICD-10-CM

## 2013-09-03 MED ORDER — CYCLOBENZAPRINE HCL 10 MG PO TABS: 10.0000 mg | ORAL_TABLET | Freq: Two times a day (BID) | ORAL | Status: DC | PRN

## 2013-09-03 MED ORDER — NAPROXEN 500 MG PO TABS: 500.0000 mg | ORAL_TABLET | Freq: Two times a day (BID) | ORAL | Status: DC
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
## 2014-09-23 ENCOUNTER — Encounter (HOSPITAL_BASED_OUTPATIENT_CLINIC_OR_DEPARTMENT_OTHER): Payer: Self-pay

## 2014-09-23 ENCOUNTER — Emergency Department (HOSPITAL_BASED_OUTPATIENT_CLINIC_OR_DEPARTMENT_OTHER)
Admission: EM | Admit: 2014-09-23 | Discharge: 2014-09-23 | Disposition: A | Discharge status: Home / Self Care | Payer: Medicare Other | Attending: Emergency Medicine | Admitting: Emergency Medicine

## 2014-09-23 DIAGNOSIS — F419 Anxiety disorder, unspecified: Secondary | ICD-10-CM | POA: Diagnosis not present

## 2014-09-23 DIAGNOSIS — N63 Unspecified lump in breast: Secondary | ICD-10-CM | POA: Diagnosis present

## 2014-09-23 DIAGNOSIS — R000 Tachycardia, unspecified: Secondary | ICD-10-CM | POA: Diagnosis not present

## 2014-09-23 DIAGNOSIS — Z8679 Personal history of other diseases of the circulatory system: Secondary | ICD-10-CM | POA: Insufficient documentation

## 2014-09-23 DIAGNOSIS — N6310 Unspecified lump in the right breast, unspecified quadrant: Secondary | ICD-10-CM

## 2014-09-23 NOTE — ED Notes (Signed)
Right breast tenderness without erythema or warmth.  Denies discharge or previous hx.

## 2014-09-23 NOTE — ED Notes (Signed)
Right breast mass-first noticed Saturday

## 2014-09-23 NOTE — ED Provider Notes (Signed)
CSN: [id number]     Arrival date & time 09/23/14  1545 History   First MD Initiated Contact with Patient 09/23/14 1557     Chief Complaint  Patient presents with  . Breast Mass     (Consider location/radiation/quality/duration/timing/severity/associated sxs/prior Treatment) HPI Comments: 34 year old female complaining of right breast mass that she noticed 4 days ago. States the area has gotten larger and is tender. States she cannot physically CMS on her breast. Denies drainage, skin color change, fevers or nipple discharge. Does not get a menstrual cycle due to being on Depo. Denies family history of breast cancer. No prior history of breast masses.  The history is provided by the patient.    Past Medical History  Diagnosis Date  . Migraines    Past Surgical History  Procedure Laterality Date  . Cholecystectomy     No family history on file. History  Substance Use Topics  . Smoking status: Never Smoker   . Smokeless tobacco: Not on file  . Alcohol Use: No   OB History    No data available     Review of Systems  Genitourinary:       + R breast mass.  Psychiatric/Behavioral: The patient is nervous/anxious.       Allergies  Percocet  Home Medications   Prior to Admission medications   Not on File   BP 134/89 mmHg  Pulse 118  Temp(Src) 98.2 F (36.8 C) (Oral)  Resp 18  Ht 5\' 7"  (1.702 m)  Wt 269 lb (122.018 kg)  BMI 42.12 kg/m2  SpO2 100% Physical Exam  Constitutional: She is oriented to person, place, and time. She appears well-developed and well-nourished. No distress.  HENT:  Head: Normocephalic and atraumatic.  Mouth/Throat: Oropharynx is clear and moist.  Eyes: Conjunctivae and EOM are normal.  Neck: Normal range of motion. Neck supple.  Cardiovascular: Regular rhythm and normal heart sounds.   Tachy.  Pulmonary/Chest: Effort normal and breath sounds normal. No respiratory distress.  Genitourinary:  4 cm x 3 cm palpable mass in R breast around 8  o'clock. Tender, mobile. No nipple discharge.  Musculoskeletal: Normal range of motion. She exhibits no edema.  Neurological: She is alert and oriented to person, place, and time. No sensory deficit.  Skin: Skin is warm and dry.  Psychiatric: Her behavior is normal. Her mood appears anxious.  Nursing note and vitals reviewed.   ED Course  Procedures (including critical care time) Labs Review Labs Reviewed - No data to display  Imaging Review No results found.   EKG Interpretation None      MDM   Final diagnoses:  Breast mass, right   NAD. Mass is tender and mobile. No fluctuance, induration, skin color change or drainage concerning for an abscess. This is possibly fibrocystic changes, however cannot rule out other causes of breast mass. Patient will need to follow-up with the breast center for a mammogram and/or ultrasound. Stable for discharge. Return precautions given. Patient states understanding of treatment care plan and is agreeable.  Carman Ching, PA-C 09/23/14 [place], MD 09/23/14 [phone]

## 2014-09-23 NOTE — Discharge Instructions (Signed)
Breast Biopsy A breast biopsy is a procedure where a sample of breast tissue is removed from your breast. The tissue is examined under a microscope to see if cancerous cells are present. A breast biopsy is done when there is:  Any undiagnosed breast mass (tumor).  Nipple abnormalities, dimpling, crusting, or ulcerations.  Abnormal discharge from the nipple, especially blood.  Redness, swelling, and pain of the breast.  Calcium deposits (calcifications) or abnormalities seen on a mammogram, ultrasound result, or results of magnetic resonance imaging (MRI).  Suspicious changes in the breast seen on your mammogram. If the tumor is found to be cancerous (malignant), a breast biopsy can help to determine what the best treatment is for you. There are many different types of breast biopsies. Talk to your caregiver about your options and which type is best for you. LET YOUR CAREGIVER KNOW ABOUT:  Allergies to food or medicine.  Medicines taken, including vitamins, herbs, eyedrops, over-the-counter medicines, and creams.  Use of steroids (by mouth or creams).  Previous problems with anesthetics or numbing medicines.  History of bleeding problems or blood clots.  Previous surgery.  Other health problems, including diabetes and kidney problems.  Any recent colds or infections.  Possibility of pregnancy, if this applies. RISKS AND COMPLICATIONS   Bleeding.  Infection.  Allergy to medicines.  Bruising and swelling of the breast.  Alteration in the shape of the breast.  Not finding the lump or abnormality.  Needing more surgery. BEFORE THE PROCEDURE  Arrange for someone to drive you home after the procedure.  Do not smoke for 2 weeks before the procedure. Stop smoking, if you smoke.  Do not drink alcohol for 24 hours before procedure.  Wear a good support bra to the procedure. PROCEDURE  You may be given a medicine to numb the breast area (local anesthesia) or a medicine  to make you sleep (general anesthesia) during the procedure. The following are the different types of biopsies that can be performed.   Fine-needle aspiration--A thin needle is attached to a syringe and inserted into the breast lump. Fluid and cells are removed and then looked at under a microscope. If the breast lump cannot be felt, an ultrasound may be used to help locate the lump and place the needle in the correct area.   Core needle biopsy--A wide, hollow needle (core needle) is inserted into the breast lump 3-6 times to get tissue samples or cores. The samples are removed. The needle is usually placed in the correct area by using an ultrasound or X-ray.   Stereotactic biopsy--X-ray equipment and a computer are used to analyze X-ray pictures of the breast lump. The computer then finds exactly where the core needle needs to be inserted. Tissue samples are removed.   Vacuum-assisted biopsy--A small incision (less than  inch) is made in your breast. A biopsy device that includes a hollow needle and vacuum is passed through the incision and into the breast tissue. The vacuum gently draws abnormal breast tissue into the needle to remove it. This type of biopsy removes a larger tissue sample than a regular core needle biopsy. No stitches are needed, and there is usually little scarring.  Ultrasound-guided core needle biopsy--A high frequency ultrasound helps guide the core needle to the area of the mass or abnormality. An incision is made to insert the needle. Tissue samples are removed.  Open biopsy--A larger incision is made in the breast. Your caregiver will attempt to remove the whole breast lump or  as much as possible. AFTER THE PROCEDURE  You will be taken to the recovery area. If you are doing well and have no problems, you will be allowed to go home.  You may notice bruising on your breast. This is normal.  Your caregiver may apply a pressure dressing on your breast for 24-48 hours. A  pressure dressing is a bandage that is wrapped tightly around the chest to stop fluid from collecting underneath tissues. Document Released: 03/27/2005 Document Revised: 07/22/2012 Document Reviewed: 04/27/2011 [facility] Patient Information 2015 [place], [place]. This information is not intended to replace advice given to you by your health care provider. Make sure you discuss any questions you have with your health care provider.  Breast Scan Breast scan is procedure done to examine dense breast tissue, which is difficult in a normal mammogram. It is used in women with breast lesions from fibrocystic disease, fibroadenoma, and fat necrosis. It also is used to determine the course of treatment for breast cancer. LET [facility] CARE PROVIDER KNOW ABOUT:  Any allergies you have.  All medicines you are taking, including vitamins, herbs, eye drops, creams, and over-the-counter medicines.  Previous problems you or members of your family have had with the use of anesthetics.  Any blood disorders you have.  Previous surgeries you have had.  Medical conditions you have.  Pregnancy or the possibility that you may be pregnant. RISKS AND COMPLICATIONS Generally, this is a safe procedure. However, as with any procedure, complications can occur. Possible complications include:   Slight discomfort from injection of radioactive substance.  Allergic reaction to contrast or radioactive substance used in exam. BEFORE THE PROCEDURE No fasting or sedation is required. PROCEDURE   You will be asked to remove all jewelry and clothing from the waist up.  An IV tube will be inserted in your arm or hand opposite the side of the breast to be examined. If both breasts are being evaluated, the IV tube may be inserted into a vein in the foot.  You will be positioned face down on a table. The breast to be imaged will be placed through an opening in the table.  The radioactive agent will be injected into the IV  tube. You may experience a slight metallic taste after the injection.  Imaging will begin a few minutes after the injection. A scanner will be placed over the breast and will record the radiation given off.  You may also be asked to get into different positions during the scan.  When the scan is complete, the IV tube is removed. AFTER THE PROCEDURE  You will be asked to get up slowly from the scanner to avoid light-headedness from lying flat during the procedure.  Drink plenty of fluids to help flush the remaining radioactive agent from your body. Document Released: 04/21/2004 Document Revised: 04/01/2013 Document Reviewed: 12/02/2012 [facility] Patient Information 2015 [place], [place]. This information is not intended to replace advice given to you by your health care provider. Make sure you discuss any questions you have with your health care provider.

## 2016-08-26 ENCOUNTER — Emergency Department (HOSPITAL_BASED_OUTPATIENT_CLINIC_OR_DEPARTMENT_OTHER): Payer: Medicare Other

## 2016-08-26 ENCOUNTER — Emergency Department (HOSPITAL_BASED_OUTPATIENT_CLINIC_OR_DEPARTMENT_OTHER)
Admission: EM | Admit: 2016-08-26 | Discharge: 2016-08-26 | Disposition: A | Discharge status: Other Acute Inpatient Hospital | Payer: Medicare Other | Attending: Emergency Medicine | Admitting: Emergency Medicine

## 2016-08-26 ENCOUNTER — Encounter (HOSPITAL_BASED_OUTPATIENT_CLINIC_OR_DEPARTMENT_OTHER): Payer: Self-pay | Admitting: *Deleted

## 2016-08-26 DIAGNOSIS — Z8614 Personal history of Methicillin resistant Staphylococcus aureus infection: Secondary | ICD-10-CM | POA: Insufficient documentation

## 2016-08-26 DIAGNOSIS — D0590 Unspecified type of carcinoma in situ of unspecified breast: Secondary | ICD-10-CM | POA: Insufficient documentation

## 2016-08-26 DIAGNOSIS — C7802 Secondary malignant neoplasm of left lung: Secondary | ICD-10-CM | POA: Diagnosis not present

## 2016-08-26 DIAGNOSIS — I10 Essential (primary) hypertension: Secondary | ICD-10-CM | POA: Insufficient documentation

## 2016-08-26 DIAGNOSIS — C7801 Secondary malignant neoplasm of right lung: Secondary | ICD-10-CM | POA: Insufficient documentation

## 2016-08-26 DIAGNOSIS — Z7901 Long term (current) use of anticoagulants: Secondary | ICD-10-CM | POA: Insufficient documentation

## 2016-08-26 DIAGNOSIS — R0789 Other chest pain: Secondary | ICD-10-CM | POA: Diagnosis present

## 2016-08-26 DIAGNOSIS — R079 Chest pain, unspecified: Secondary | ICD-10-CM

## 2016-08-26 DIAGNOSIS — Z79899 Other long term (current) drug therapy: Secondary | ICD-10-CM | POA: Insufficient documentation

## 2016-08-26 DIAGNOSIS — I2699 Other pulmonary embolism without acute cor pulmonale: Secondary | ICD-10-CM | POA: Insufficient documentation

## 2016-08-26 HISTORY — DX: Pneumonia, unspecified organism: J18.9

## 2016-08-26 HISTORY — DX: Malignant (primary) neoplasm, unspecified: C80.1

## 2016-08-26 HISTORY — DX: Methicillin resistant Staphylococcus aureus infection, unspecified site: A49.02

## 2016-08-26 HISTORY — DX: Disorder of kidney and ureter, unspecified: N28.9

## 2016-08-26 HISTORY — DX: Essential (primary) hypertension: I10

## 2016-08-26 LAB — COMPREHENSIVE METABOLIC PANEL
ALBUMIN: 3.4 g/dL — AB (ref 3.5–5.0)
ALT: 18 U/L (ref 14–54)
ANION GAP: 7 (ref 5–15)
AST: 23 U/L (ref 15–41)
Alkaline Phosphatase: 81 U/L (ref 38–126)
BILIRUBIN TOTAL: 0.4 mg/dL (ref 0.3–1.2)
BUN: 8 mg/dL (ref 6–20)
CO2: 24 mmol/L (ref 22–32)
Calcium: 8.3 mg/dL — ABNORMAL LOW (ref 8.9–10.3)
Chloride: 108 mmol/L (ref 101–111)
Creatinine, Ser: 1.34 mg/dL — ABNORMAL HIGH (ref 0.44–1.00)
GFR calc Af Amer: 57 mL/min — ABNORMAL LOW (ref >60)
GFR calc non Af Amer: 50 mL/min — ABNORMAL LOW (ref >60)
GLUCOSE: 97 mg/dL (ref 65–99)
POTASSIUM: 3.8 mmol/L (ref 3.5–5.1)
Sodium: 139 mmol/L (ref 135–145)
TOTAL PROTEIN: 7.1 g/dL (ref 6.5–8.1)

## 2016-08-26 LAB — TROPONIN I

## 2016-08-26 LAB — CBC
HEMATOCRIT: 25.1 % — AB (ref 36.0–46.0)
Hemoglobin: 8.3 g/dL — ABNORMAL LOW (ref 12.0–15.0)
MCH: 30.9 pg (ref 26.0–34.0)
MCHC: 33.1 g/dL (ref 30.0–36.0)
MCV: 93.3 fL (ref 78.0–100.0)
Platelets: 339 10*3/uL (ref 150–400)
RBC: 2.69 MIL/uL — ABNORMAL LOW (ref 3.87–5.11)
RDW: 14.9 % (ref 11.5–15.5)
WBC: 2.9 10*3/uL — ABNORMAL LOW (ref 4.0–10.5)

## 2016-08-26 MED ORDER — LEVETIRACETAM 500 MG PO TABS
500.0000 mg | ORAL_TABLET | Freq: Two times a day (BID) | ORAL | Status: DC
  Administered 2016-08-26: 500 mg via ORAL
  Filled 2016-08-26: qty 1

## 2016-08-26 MED ORDER — HYDROMORPHONE HCL 1 MG/ML IJ SOLN
1.0000 mg | Freq: Once | INTRAMUSCULAR | Status: AC
  Administered 2016-08-26: 1 mg via INTRAVENOUS
  Filled 2016-08-26: qty 1

## 2016-08-26 MED ORDER — DEXAMETHASONE SODIUM PHOSPHATE 10 MG/ML IJ SOLN
10.0000 mg | Freq: Once | INTRAMUSCULAR | Status: AC
  Administered 2016-08-26: 10 mg via INTRAVENOUS
  Filled 2016-08-26: qty 1

## 2016-08-26 MED ORDER — ALPRAZOLAM 0.5 MG PO TABS
0.5000 mg | ORAL_TABLET | Freq: Three times a day (TID) | ORAL | Status: DC | PRN
  Administered 2016-08-26: 0.5 mg via ORAL
  Filled 2016-08-26: qty 1

## 2016-08-26 MED ORDER — HEPARIN BOLUS VIA INFUSION
5000.0000 [IU] | Freq: Once | INTRAVENOUS | Status: AC
  Administered 2016-08-26: 5000 [IU] via INTRAVENOUS

## 2016-08-26 MED ORDER — HEPARIN (PORCINE) IN NACL 100-0.45 UNIT/ML-% IJ SOLN
1400.0000 [IU]/h | INTRAMUSCULAR | Status: DC
  Administered 2016-08-26: 1400 [IU]/h via INTRAVENOUS
  Filled 2016-08-26: qty 250

## 2016-08-26 MED ORDER — PROCHLORPERAZINE EDISYLATE 5 MG/ML IJ SOLN
10.0000 mg | Freq: Once | INTRAMUSCULAR | Status: AC
  Administered 2016-08-26: 10 mg via INTRAVENOUS
  Filled 2016-08-26: qty 2

## 2016-08-26 MED ORDER — BUSPIRONE HCL 5 MG PO TABS
5.0000 mg | ORAL_TABLET | Freq: Three times a day (TID) | ORAL | Status: DC
  Filled 2016-08-26: qty 1

## 2016-08-26 MED ORDER — HYDROMORPHONE HCL 1 MG/ML IJ SOLN
0.5000 mg | Freq: Once | INTRAMUSCULAR | Status: AC
  Administered 2016-08-26: 0.5 mg via INTRAVENOUS
  Filled 2016-08-26: qty 1

## 2016-08-26 MED ORDER — DIPHENHYDRAMINE HCL 50 MG/ML IJ SOLN
25.0000 mg | Freq: Once | INTRAMUSCULAR | Status: AC
  Administered 2016-08-26: 25 mg via INTRAVENOUS
  Filled 2016-08-26: qty 1

## 2016-08-26 MED ORDER — PANTOPRAZOLE SODIUM 40 MG PO TBEC
40.0000 mg | DELAYED_RELEASE_TABLET | Freq: Two times a day (BID) | ORAL | Status: DC
  Administered 2016-08-26: 40 mg via ORAL
  Filled 2016-08-26: qty 1

## 2016-08-26 MED ORDER — MORPHINE SULFATE (PF) 4 MG/ML IV SOLN
6.0000 mg | Freq: Once | INTRAVENOUS | Status: AC
  Administered 2016-08-26: 6 mg via INTRAVENOUS
  Filled 2016-08-26: qty 2

## 2016-08-26 MED ORDER — IOPAMIDOL (ISOVUE-370) INJECTION 76%
100.0000 mL | Freq: Once | INTRAVENOUS | Status: AC | PRN
  Administered 2016-08-26: 100 mL via INTRAVENOUS

## 2016-08-26 NOTE — ED Notes (Signed)
Called Care and spoke with Judson Roch for consult to Hospitalist at [facility] @ 12:02

## 2016-08-26 NOTE — ED Notes (Signed)
Placed 4th call to [facility] @2 :31pm

## 2016-08-26 NOTE — ED Notes (Signed)
Patient transported to X-ray 

## 2016-08-26 NOTE — ED Notes (Signed)
Pt requesting pain medications for chest. 5/10

## 2016-08-26 NOTE — ED Notes (Signed)
Placed 3rd call to [facility] @1 :57pm

## 2016-08-26 NOTE — Progress Notes (Signed)
ANTICOAGULATION CONSULT NOTE - Initial Consult  Pharmacy Consult for heparin Indication: pulmonary embolus  Allergies  Allergen Reactions  . Percocet [Oxycodone-Acetaminophen] Itching    Patient Measurements: Height: 5\' 7"  (170.2 cm) Weight: 230 lb (104.3 kg) IBW/kg (Calculated) : 61.6 Heparin Dosing Weight: 85kg  Vital Signs: Temp: 98.1 F (36.7 C) (05/19 1216) Temp Source: Oral (05/19 1216) BP: 119/75 (05/19 1300) Pulse Rate: 109 (05/19 1300)  Labs:  Recent Labs  08/26/16 0808  HGB 8.3*  HCT 25.1*  PLT 339  CREATININE 1.34*  TROPONINI <0.03    Estimated Creatinine Clearance: 70.7 mL/min (A) (by C-G formula based on SCr of 1.34 mg/dL (H)).   Medical History: Past Medical History:  Diagnosis Date  . 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 Hypertension   . Migraines   . MRSA (methicillin resistant Staphylococcus aureus)   . Pneumonia   . Renal disorder   . Renal insufficiency     Assessment: 36 yo female with PE> pharmacy consulted to dose heparin  -hg= 8.3, plt= 339   Goal of Therapy:  Heparin level 0.3-0.7 units/ml Monitor platelets by anticoagulation protocol: Yes   Plan:  -Heparin bolus 5000 units IV followed by 1400 units/hr (~ 18units/kg/hr) -Heparin level in 6 hours and daily wth CBC daily  Hildred Laser, Pharm D 08/26/2016 3:39 PM

## 2016-08-26 NOTE — ED Notes (Signed)
Pt states she doesn't want to wait until X-ray-confirmed placement of central line to receive medications and wants an attempt at PIV. Unsuccessful attempt x1 in L hand (pt's requested site for access).

## 2016-08-26 NOTE — ED Notes (Signed)
Pt transferred to [facility] via [facility] staff.

## 2016-08-26 NOTE — ED Notes (Signed)
Placed page to [facility] for admit per Dr. Wilma Flavin Request

## 2016-08-26 NOTE — ED Triage Notes (Signed)
MD at bedside. Pt reports chronic R-sided chest pain (reports bil lung ca and pneumonia) x3-4 wks. Also reports sob and cough (able to speak in complete sentences). Pt reports being discharged from [facility] 2 days ago. Pt has R double-lumen subclavian central line present.

## 2016-08-26 NOTE — ED Provider Notes (Signed)
[facility] DEPT [facility] Provider Note   CSN: [id number] Arrival date & time: 08/26/16  0704     History   Chief Complaint Chief Complaint  Patient presents with  . Pain    HPI Loretta Khan is a 36 y.o. female.  The history is provided by the patient.  Chest Pain   This is a new problem. Episode onset: 3 weeks. The problem occurs constantly. The problem has not changed since onset.The pain is associated with breathing and movement. The pain is present in the lateral region (right). The pain is severe. The quality of the pain is described as stabbing. Radiates to: across to her left chest. Associated symptoms include malaise/fatigue, nausea, shortness of breath and vomiting. Pertinent negatives include no fever, no hemoptysis and no lower extremity edema.  Her past medical history is significant for cancer and DVT.   Patient had a recent extensive history including new diagnosis of DVT, was placed on Xarelto. Patient was just recently discharged from [facility] for sepsis secondary to MRSA pneumonia. During that time patient was receiving chemotherapy which resulted in renal insufficiency with a creatinine Of 11. At that time patient Xarelto was discontinued and patient was transitioned to Eliquis. She received her full course of IV antibiotics to treat her MRSA pneumonia.   Past Medical History:  Diagnosis Date  . 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 Hypertension   . Migraines   . MRSA (methicillin resistant Staphylococcus aureus)   . Pneumonia   . Renal disorder   . Renal insufficiency     There are no active problems to display for this patient.   Past Surgical History:  Procedure Laterality Date  . CESAREAN SECTION    . CHOLECYSTECTOMY    . MASTECTOMY Bilateral     OB History    No data available       Home Medications    Prior to Admission medications   Medication Sig Start Date End Date Taking? Authorizing Provider  ALPRAZolam Duanne Moron)  0.5 MG tablet Take 0.5 mg by mouth 3 (three) times daily as needed for anxiety.   Yes [provider]  apixaban (ELIQUIS) 5 MG TABS tablet Take 5 mg by mouth 2 (two) times daily.   Yes [provider]  busPIRone (BUSPAR) 5 MG tablet Take 5 mg by mouth 3 (three) times daily.   Yes [provider]  guaifenesin (ROBITUSSIN) 100 MG/5ML syrup Take 200 mg by mouth 3 (three) times daily as needed for cough.   Yes [provider]  HYDROcodone-acetaminophen (NORCO/VICODIN) 5-325 MG tablet Take 1 tablet by mouth every 6 (six) hours as needed for moderate pain.   Yes [provider]  levETIRAcetam (KEPPRA) 500 MG tablet Take 500 mg by mouth 2 (two) times daily.   Yes [provider]  magnesium oxide (MAG-OX) 400 MG tablet Take 400 mg by mouth 2 (two) times daily.   Yes [provider]  medroxyPROGESTERone (DEPO-PROVERA) 150 MG/ML injection Inject 150 mg into the muscle every 3 (three) months.   Yes [provider]  oxyCODONE-acetaminophen (PERCOCET/ROXICET) 5-325 MG tablet Take 1 tablet by mouth every 6 (six) hours as needed for severe pain.   Yes [provider]  pantoprazole (PROTONIX) 40 MG tablet Take 40 mg by mouth 2 (two) times daily before a meal.   Yes [provider]  prochlorperazine (COMPAZINE) 10 MG tablet Take 10 mg by mouth every 6 (six) hours as needed for  nausea or vomiting.   Yes [provider]  senna-docusate (SENOKOT-S) 8.6-50 MG tablet Take 1 tablet by mouth at bedtime.   Yes [provider]  zolpidem (AMBIEN) 10 MG tablet Take 10 mg by mouth at bedtime as needed for sleep.   Yes [provider]    Family History No family history on file.  Social History Social History  Substance Use Topics  . Smoking status: Never Smoker  . Smokeless tobacco: Never Used  . Alcohol use No     Allergies   Percocet [oxycodone-acetaminophen]   Review of Systems Review of Systems   Constitutional: Positive for malaise/fatigue. Negative for fever.  Respiratory: Positive for shortness of breath. Negative for hemoptysis.   Cardiovascular: Positive for chest pain.  Gastrointestinal: Positive for nausea and vomiting.   All other systems are reviewed and are negative for acute change except as noted in the HPI   Physical Exam Updated Vital Signs BP 119/75   Pulse (!) 109   Temp 98.1 F (36.7 C) (Oral)   Resp (!) 25   Ht 5\' 7"  (1.702 m)   Wt 230 lb (104.3 kg)   LMP 08/12/2016 (Approximate)   SpO2 100%   BMI 36.02 kg/m   Physical Exam  Constitutional: She is oriented to person, place, and time. She appears well-developed and well-nourished. No distress.  HENT:  Head: Normocephalic and atraumatic.  Nose: Nose normal.  Eyes: Conjunctivae and EOM are normal. Pupils are equal, round, and reactive to light. Right eye exhibits no discharge. Left eye exhibits no discharge. No scleral icterus.  Neck: Normal range of motion. Neck supple.  Cardiovascular: Normal rate and regular rhythm.  Exam reveals no gallop and no friction rub.   No murmur heard. Pulmonary/Chest: Effort normal. No stridor. No respiratory distress. She has wheezes in the right middle field and the left lower field. She has rales in the right upper field and the left middle field. She exhibits tenderness.    Bilateral mastectomies  Abdominal: Soft. She exhibits no distension. There is no tenderness.  Musculoskeletal: She exhibits no edema or tenderness.  Neurological: She is alert and oriented to person, place, and time.  Skin: Skin is warm and dry. No rash noted. She is not diaphoretic. No erythema.  Psychiatric: She has a normal mood and affect.  Vitals reviewed.    ED Treatments / Results  Labs (all labs ordered are listed, but only abnormal results are displayed) Labs Reviewed  CBC - Abnormal; Notable for the following:       Result Value   WBC 2.9 (*)    RBC 2.69 (*)    Hemoglobin 8.3  (*)    HCT 25.1 (*)    All other components within normal limits  COMPREHENSIVE METABOLIC PANEL - Abnormal; Notable for the following:    Creatinine, Ser 1.34 (*)    Calcium 8.3 (*)    Albumin 3.4 (*)    GFR calc non Af Amer 50 (*)    GFR calc Af Amer 57 (*)    All other components within normal limits  TROPONIN I    EKG  EKG Interpretation None       Radiology Dg Chest 2 View  Result Date: 08/26/2016 CLINICAL DATA:  Right-sided chest pain.  Recent bilateral pneumonia. EXAM: CHEST  2 VIEW COMPARISON:  None. FINDINGS: Heart size is normal. Lung volumes are low. A right pleural effusion is slightly more prominent than on the prior exam. Patchy airspace disease is again  seen bilaterally in the lower lobes. IMPRESSION: 1. Slight increase in bilateral lower lobe pneumonia, right greater than left. 2. Slight increase in right pleural effusion. Electronically Signed   By: San Morelle M.D.   On: 08/26/2016 08:41   Ct Angio Chest Pe W Or Wo Contrast  Result Date: 08/26/2016 CLINICAL DATA:  Right side chest pain.  Bilateral lung cancer. EXAM: CT ANGIOGRAPHY CHEST WITH CONTRAST TECHNIQUE: Multidetector CT imaging of the chest was performed using the standard protocol during bolus administration of intravenous contrast. Multiplanar CT image reconstructions and MIPs were obtained to evaluate the vascular anatomy. CONTRAST:  100 cc Isovue 370 IV COMPARISON:  08/03/2016 FINDINGS: Cardiovascular: Heart is borderline in size. Aorta is normal caliber. Partially obstructing filling defect noted in the right lower lobe segmental pulmonary artery. Mediastinum/Nodes: Previously seen prominent bilateral hilar lymph nodes, right larger than left have decreased in size. Largest right hilar lymph node currently has a short axis diameter of 9 mm on image 35. Small scattered mediastinal lymph nodes, none pathologically enlarged. No axillary adenopathy. Lungs/Pleura: Small right pleural effusion, decreased  since prior study. Nodular airspace opacities throughout both lungs have decreased since prior study. Upper Abdomen: Imaging into the upper abdomen shows no acute findings. Musculoskeletal: Extensive collateral vessels again noted in the chest wall, stable. No acute or focal bony abnormality. Review of the MIP images confirms the above findings. IMPRESSION: Nodular airspace disease within lungs has improved since prior study, likely improving pneumonia and/or metastatic disease. Improving small right pleural effusion. Small partially obstructing filling defect in the right lower lobe pulmonary artery compatible with small pulmonary embolus. No evidence of right heart strain. Electronically Signed   By: Rolm Baptise M.D.   On: 08/26/2016 09:43    Procedures Procedures (including critical care time)  Medications Ordered in ED Medications  HYDROmorphone (DILAUDID) injection 0.5 mg (not administered)  morphine 4 MG/ML injection 6 mg (6 mg Intravenous Given 08/26/16 0825)  diphenhydrAMINE (BENADRYL) injection 25 mg (25 mg Intravenous Given 08/26/16 0818)  prochlorperazine (COMPAZINE) injection 10 mg (10 mg Intravenous Given 08/26/16 0820)  dexamethasone (DECADRON) injection 10 mg (10 mg Intravenous Given 08/26/16 0822)  iopamidol (ISOVUE-370) 76 % injection 100 mL (100 mLs Intravenous Contrast Given 08/26/16 0923)  HYDROmorphone (DILAUDID) injection 1 mg (1 mg Intravenous Given 08/26/16 1019)  HYDROmorphone (DILAUDID) injection 0.5 mg (0.5 mg Intravenous Given 08/26/16 1342)     Initial Impression / Assessment and Plan / ED Course  I have reviewed the triage vital signs and the nursing notes.  Pertinent labs & imaging results that were available during my care of the patient were reviewed by me and considered in my medical decision making (see chart for details).     Presentation consistent with peripheral pulmonary embolism. Given a history of cancer and complicated recent history I discussed the case  with the hospitalist had a [place] [facility] who recommended admission to transitioned to Lovenox.  Pt started on Heparin gtt.  Will transfer to [facility] for further management.  Final Clinical Impressions(s) / ED Diagnoses   Final diagnoses:  Chest pain  Other acute pulmonary embolism without acute cor pulmonale ([place])     Cardama, Grayce Sessions, MD 08/26/16 1535

## 2016-08-26 NOTE — ED Notes (Signed)
Pt is unable to remember what medicines she takes -- is attempting to obtain the list from a family member at home. Pt also reports she wants her central line removed and states she can take it out herself. Pt instructed that she should not attempt to remove central line -- educated about and verbalized understanding of the potential life-threatening risks of doing so.

## 2016-08-26 NOTE — ED Notes (Signed)
Placed 2nd call to [facility] for callback to Physician @13 :34.

## 2016-08-28 ENCOUNTER — Emergency Department (HOSPITAL_BASED_OUTPATIENT_CLINIC_OR_DEPARTMENT_OTHER)
Admission: EM | Admit: 2016-08-28 | Discharge: 2016-08-28 | Disposition: A | Discharge status: Home / Self Care | Payer: Medicare Other | Attending: Physician Assistant | Admitting: Physician Assistant

## 2016-08-28 ENCOUNTER — Encounter (HOSPITAL_BASED_OUTPATIENT_CLINIC_OR_DEPARTMENT_OTHER): Payer: Self-pay | Admitting: Emergency Medicine

## 2016-08-28 ENCOUNTER — Emergency Department (HOSPITAL_BASED_OUTPATIENT_CLINIC_OR_DEPARTMENT_OTHER): Payer: Medicare Other

## 2016-08-28 DIAGNOSIS — R002 Palpitations: Secondary | ICD-10-CM | POA: Diagnosis present

## 2016-08-28 DIAGNOSIS — Z79899 Other long term (current) drug therapy: Secondary | ICD-10-CM | POA: Diagnosis not present

## 2016-08-28 DIAGNOSIS — R05 Cough: Secondary | ICD-10-CM | POA: Insufficient documentation

## 2016-08-28 DIAGNOSIS — X58XXXA Exposure to other specified factors, initial encounter: Secondary | ICD-10-CM

## 2016-08-28 DIAGNOSIS — R079 Chest pain, unspecified: Secondary | ICD-10-CM | POA: Insufficient documentation

## 2016-08-28 DIAGNOSIS — Z5321 Procedure and treatment not carried out due to patient leaving prior to being seen by health care provider: Secondary | ICD-10-CM | POA: Insufficient documentation

## 2016-08-28 DIAGNOSIS — I10 Essential (primary) hypertension: Secondary | ICD-10-CM | POA: Insufficient documentation

## 2016-08-28 DIAGNOSIS — R0602 Shortness of breath: Secondary | ICD-10-CM | POA: Insufficient documentation

## 2016-08-28 NOTE — ED Notes (Signed)
Pt informed radiology that she is going to another hospital and walked out

## 2016-08-28 NOTE — ED Triage Notes (Signed)
MD at bedside. Pt reports chronic R-sided chest pain (reports bil lung ca and pneumonia) x3-4 wks. Also reports sob and cough. Pt has R double-lumen subclavian central line present. Patent was here a few days ago and was found to have multiple blood clots/

## 2016-09-13 ENCOUNTER — Emergency Department (HOSPITAL_BASED_OUTPATIENT_CLINIC_OR_DEPARTMENT_OTHER)
Admission: EM | Admit: 2016-09-13 | Discharge: 2016-09-14 | Disposition: A | Discharge status: Home / Self Care | Payer: Medicare Other | Attending: Emergency Medicine | Admitting: Emergency Medicine

## 2016-09-13 ENCOUNTER — Emergency Department (HOSPITAL_BASED_OUTPATIENT_CLINIC_OR_DEPARTMENT_OTHER): Payer: Medicare Other

## 2016-09-13 ENCOUNTER — Encounter (HOSPITAL_BASED_OUTPATIENT_CLINIC_OR_DEPARTMENT_OTHER): Payer: Self-pay | Admitting: Emergency Medicine

## 2016-09-13 DIAGNOSIS — R05 Cough: Secondary | ICD-10-CM | POA: Insufficient documentation

## 2016-09-13 DIAGNOSIS — R197 Diarrhea, unspecified: Secondary | ICD-10-CM

## 2016-09-13 DIAGNOSIS — I10 Essential (primary) hypertension: Secondary | ICD-10-CM | POA: Diagnosis not present

## 2016-09-13 DIAGNOSIS — Z79899 Other long term (current) drug therapy: Secondary | ICD-10-CM | POA: Diagnosis not present

## 2016-09-13 DIAGNOSIS — G8929 Other chronic pain: Secondary | ICD-10-CM | POA: Insufficient documentation

## 2016-09-13 DIAGNOSIS — R059 Cough, unspecified: Secondary | ICD-10-CM

## 2016-09-13 DIAGNOSIS — R112 Nausea with vomiting, unspecified: Secondary | ICD-10-CM | POA: Diagnosis present

## 2016-09-13 HISTORY — DX: Esophagitis, unspecified: K20.9

## 2016-09-13 HISTORY — DX: Other chronic pain: G89.29

## 2016-09-13 HISTORY — DX: Anemia, unspecified: D64.9

## 2016-09-13 HISTORY — DX: Esophagitis, unspecified without bleeding: K20.90

## 2016-09-13 HISTORY — DX: Chest pain, unspecified: R07.9

## 2016-09-13 MED ORDER — SODIUM CHLORIDE 0.9 % IV BOLUS (SEPSIS)
1000.0000 mL | Freq: Once | INTRAVENOUS | Status: AC
Start: 2016-09-13 — End: 2016-09-14
  Administered 2016-09-13: 1000 mL via INTRAVENOUS

## 2016-09-13 MED ORDER — ONDANSETRON HCL 4 MG/2ML IJ SOLN
4.0000 mg | Freq: Once | INTRAMUSCULAR | Status: AC
  Administered 2016-09-14: 4 mg via INTRAVENOUS
  Filled 2016-09-13: qty 2

## 2016-09-13 MED ORDER — MORPHINE SULFATE (PF) 4 MG/ML IV SOLN
4.0000 mg | Freq: Once | INTRAVENOUS | Status: AC
Start: 2016-09-13 — End: 2016-09-14
  Administered 2016-09-14: 4 mg via INTRAVENOUS
  Filled 2016-09-13: qty 1

## 2016-09-13 MED ORDER — HEPARIN SOD (PORK) LOCK FLUSH 100 UNIT/ML IV SOLN
INTRAVENOUS | Status: AC
  Administered 2016-09-14: 500 [IU]
  Filled 2016-09-13: qty 5

## 2016-09-13 NOTE — ED Triage Notes (Signed)
C/o n/v/d, chills, cough with CP x today-NAD-steady gait-pt checked into [facility] ED but LWBS

## 2016-09-13 NOTE — ED Notes (Signed)
Pt LWBS from [facility] earlier today and had CBC, CMP and Troponin completed there today.

## 2016-09-13 NOTE — ED Provider Notes (Signed)
[place] DEPT [facility] Provider Note   CSN: [id number] Arrival date & time: 09/13/16  2118  By signing my name below, I, Ephriam Jenkins, attest that this documentation has been prepared under the direction and in the presence of [facility].  Electronically Signed: Ephriam Jenkins, ED Scribe. 09/13/16. 11:46 PM.   History   Chief Complaint Chief Complaint  Patient presents with  . Diarrhea    HPI HPI Comments: Loretta Khan is a 36 y.o. female, [place] pt, who presents to the Emergency Department complaining of generalized abdominal pain with associated n/v/d that started two days ago. Pt reports exacerbation of pain when taking a deep breath and when coughing. Two episodes of diarrhea today and three episodes of vomiting. 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 She is being followed by an oncologist at [facility]. She states that she is not having trouble breathing at the moment. No hematochezia. No dysuria. No vaginal discharge or bleeding. Seen for similar at outside ER on 5/30 and 6/02. No meds taken prior to arrival for symptoms. She has been taking all of her regular meds as directed including lovenox injections.   The history is provided by the patient. No language interpreter was used.    Past Medical History:  Diagnosis Date  . 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 Chronic anemia   . Chronic chest pain   . Esophagitis   . Hypertension   . Migraines   . MRSA (methicillin resistant Staphylococcus aureus)   . Pneumonia   . Renal disorder   . Renal insufficiency     There are no active problems to display for this patient.   Past Surgical History:  Procedure Laterality Date  . CESAREAN SECTION    . CHOLECYSTECTOMY    . MASTECTOMY Bilateral     OB History    No data available       Home Medications    Prior to Admission medications   Medication Sig Start Date End Date Taking? Authorizing Provider  busPIRone  (BUSPAR) 5 MG tablet Take 5 mg by mouth 3 (three) times daily.    [provider]  HYDROcodone-acetaminophen (NORCO/VICODIN) 5-325 MG tablet Take 1 tablet by mouth every 6 (six) hours as needed for moderate pain.    [provider]  ondansetron (ZOFRAN ODT) 4 MG disintegrating tablet Take 1 tablet (4 mg total) by mouth every 8 (eight) hours as needed for nausea or vomiting. 09/14/16   Yorley Buch, Ozella Almond, PA-C  zolpidem (AMBIEN) 10 MG tablet Take 10 mg by mouth at bedtime as needed for sleep.    [provider]    Family History No family history on file.  Social History Social History  Substance Use Topics  . Smoking status: Never Smoker  . Smokeless tobacco: Never Used  . Alcohol use No     Allergies   Percocet [oxycodone-acetaminophen]   Review of Systems Review of Systems  Respiratory: Positive for cough. Negative for shortness of breath.   Cardiovascular: Negative for chest pain.  Gastrointestinal: Positive for abdominal pain, diarrhea and vomiting. Negative for blood in stool.  Genitourinary: Negative for vaginal bleeding.  Neurological: Positive for headaches.  All other systems reviewed and are negative.    Physical Exam Updated Vital Signs BP (!) 121/92 (BP Location: Left Arm)   Pulse 92   Temp 99.1 F (37.3 C) (Oral)   Resp 20   Ht 5'  7" (1.702 m)   Wt 100.7 kg (222 lb)   LMP  (LMP Unknown)   SpO2 98%   BMI 34.77 kg/m   Physical Exam  Constitutional: She is oriented to person, place, and time. She appears well-developed and well-nourished. No distress.  HENT:  Head: Normocephalic and atraumatic.  Cardiovascular: Normal rate, regular rhythm and normal heart sounds.   No murmur heard. Pulmonary/Chest: Effort normal and breath sounds normal. No respiratory distress. She has no wheezes. She has no rales.  Abdominal: Soft. Bowel sounds are normal. She exhibits no distension. There is tenderness (generalized). There is no rebound and  no guarding.  Musculoskeletal: She exhibits no edema.  Neurological: She is alert and oriented to person, place, and time.  Skin: Skin is warm and dry.  Nursing note and vitals reviewed.    ED Treatments / Results  DIAGNOSTIC STUDIES: Oxygen Saturation is 98% on RA, normal by my interpretation.  COORDINATION OF CARE: 11:37 PM-Discussed treatment plan with pt at bedside and pt agreed to plan.   Labs (all labs ordered are listed, but only abnormal results are displayed) Labs Reviewed - No data to display  EKG  EKG Interpretation  Date/Time:  Wednesday September 13 2016 21:28:17 EDT Ventricular Rate:  104 PR Interval:  150 QRS Duration: 72 QT Interval:  390 QTC Calculation: 512 R Axis:   77 Text Interpretation:  Sinus tachycardia Otherwise normal ECG Confirmed by Fredia Sorrow [phone]) on 09/13/2016 9:40:33 PM Also confirmed by Fredia Sorrow [phone]), editor Verna Czech [phone])  on 09/14/2016 9:20:29 AM       Radiology Dg Abdomen Acute W/chest  Result Date: 09/14/2016 CLINICAL DATA:  Cough and abdominal pain. EXAM: DG ABDOMEN ACUTE W/ 1V CHEST COMPARISON:  Multiple prior exams most recent chest radiographs 09/09/2016. Most recent abdominal series 09/06/16. Recent chest and abdominal CT ulcer reviewed FINDINGS: Right central line with tip in the proximal SVC. Unchanged heart size and mediastinal contours. Small right pleural effusion and bibasilar heterogeneous airspace opacities, stable from prior exam. Normal bowel gas pattern without evidence of obstruction. Small to moderate stool burden throughout the colon. No free air. Cholecystectomy clips in the right upper quadrant. No acute osseous abnormality. IMPRESSION: Unchanged radiographic appearance of the chest and abdomen from prior exams. Persistent small right pleural effusion and heterogeneous bibasilar airspace opacities. Normal bowel gas pattern with small to moderate stool burden. Electronically Signed   By: Jeb Levering  M.D.   On: 09/14/2016 01:03    Procedures Procedures (including critical care time)  Medications Ordered in ED Medications  sodium chloride 0.9 % bolus 1,000 mL (0 mLs Intravenous Stopped 09/14/16 0137)  morphine 4 MG/ML injection 4 mg (4 mg Intravenous Given 09/14/16 0003)  ondansetron (ZOFRAN) injection 4 mg (4 mg Intravenous Given 09/14/16 0003)  heparin lock flush 100 UNIT/ML injection (500 Units  Given 09/14/16 0136)  traMADol (ULTRAM) tablet 50 mg (50 mg Oral Given 09/14/16 0135)  ondansetron (ZOFRAN-ODT) disintegrating tablet 4 mg (4 mg Oral Given 09/14/16 0135)     Initial Impression / Assessment and Plan / ED Course  I have reviewed the triage vital signs and the nursing notes.  Pertinent labs & imaging results that were available during my care of the patient were reviewed by me and considered in my medical decision making (see chart for details).    Loretta Khan is a 36 y.o. female who presents to ED for n/v/d as well as cough. She actually went to [facility] where labs were performed  however she left prior to being seen. Labs from this encounter were reviewed. Baseline anemia. Reassuring. Non-surgical abdominal exam. IV fluids and nausea medications given. Acute abdominal series reassuring. On repeat examination, patient is now tolerating PO with no episodes of emesis since medication administration. Repeat abdominal exam with no peritoneal signs. Evaluation does not show pathology that would require ongoing emergent intervention or inpatient treatment. Rx for zofran given. PCP follow up encouraged. Spoke at length with patient about signs or symptoms that should prompt return to Emergency Department. Patient understands diagnosis and plan of care as dictated above. All questions answered.   Patientdiscussed with Dr. Canary Brim who agrees with treatment plan.   Final Clinical Impressions(s) / ED Diagnoses   Final diagnoses:  Nausea vomiting and diarrhea  Cough in adult     New  Prescriptions Discharge Medication List as of 09/14/2016  1:22 AM    START taking these medications   Details  ondansetron (ZOFRAN ODT) 4 MG disintegrating tablet Take 1 tablet (4 mg total) by mouth every 8 (eight) hours as needed for nausea or vomiting., Starting Thu 09/14/2016, Print       I personally performed the services described in this documentation, which was scribed in my presence. The recorded information has been reviewed and is accurate.     Debbrah Sampedro, Ozella Almond, PA-C 09/14/16 2052    Alfonzo Beers, MD 09/14/16 [phone]

## 2016-09-14 MED ORDER — ONDANSETRON 4 MG PO TBDP
4.0000 mg | ORAL_TABLET | Freq: Once | ORAL | Status: AC
  Administered 2016-09-14: 4 mg via ORAL
  Filled 2016-09-14: qty 1

## 2016-09-14 MED ORDER — TRAMADOL HCL 50 MG PO TABS
50.0000 mg | ORAL_TABLET | Freq: Once | ORAL | Status: AC
  Administered 2016-09-14: 50 mg via ORAL
  Filled 2016-09-14: qty 1

## 2016-09-14 MED ORDER — HEPARIN SOD (PORK) LOCK FLUSH 100 UNIT/ML IV SOLN
INTRAVENOUS | Status: AC
  Filled 2016-09-14: qty 5

## 2016-09-14 MED ORDER — ONDANSETRON 4 MG PO TBDP: 4.0000 mg | ORAL_TABLET | Freq: Three times a day (TID) | ORAL | 0 refills | Status: AC | PRN

## 2016-09-14 NOTE — Discharge Instructions (Signed)
Follow up with your primary care doctor to discuss your hospital visit. Continue to hydrate orally with small sips of fluids throughout the day. Use Zofran as directed for nausea & vomiting.   The 'BRAT' diet is suggested, then progress to diet as tolerated as symptoms abate.  Bananas.  Rice.  Applesauce.  Toast (and other simple starches such as crackers, potatoes, noodles).   SEEK IMMEDIATE MEDICAL ATTENTION IF: You begin having localized abdominal pain that does not go away or becomes severe A temperature above 101 develops Repeated vomiting occurs (multiple uncontrollable episodes) or you are unable to keep fluids down Blood is being passed in stools or vomit (bright red or black tarry stools).  New or worsening symptoms develop or you have any additional concerns.

## 2016-10-08 ENCOUNTER — Emergency Department (HOSPITAL_BASED_OUTPATIENT_CLINIC_OR_DEPARTMENT_OTHER): Payer: Medicare Other

## 2016-10-08 ENCOUNTER — Encounter (HOSPITAL_BASED_OUTPATIENT_CLINIC_OR_DEPARTMENT_OTHER): Payer: Self-pay | Admitting: Emergency Medicine

## 2016-10-08 ENCOUNTER — Emergency Department (HOSPITAL_BASED_OUTPATIENT_CLINIC_OR_DEPARTMENT_OTHER)
Admission: EM | Admit: 2016-10-08 | Discharge: 2016-10-08 | Disposition: A | Discharge status: Home / Self Care | Payer: Medicare Other | Attending: Emergency Medicine | Admitting: Emergency Medicine

## 2016-10-08 DIAGNOSIS — I10 Essential (primary) hypertension: Secondary | ICD-10-CM | POA: Diagnosis not present

## 2016-10-08 DIAGNOSIS — R079 Chest pain, unspecified: Secondary | ICD-10-CM | POA: Diagnosis present

## 2016-10-08 DIAGNOSIS — N289 Disorder of kidney and ureter, unspecified: Secondary | ICD-10-CM | POA: Diagnosis not present

## 2016-10-08 DIAGNOSIS — D0222 Carcinoma in situ of left bronchus and lung: Secondary | ICD-10-CM | POA: Diagnosis not present

## 2016-10-08 DIAGNOSIS — Z79899 Other long term (current) drug therapy: Secondary | ICD-10-CM | POA: Insufficient documentation

## 2016-10-08 DIAGNOSIS — Z853 Personal history of malignant neoplasm of breast: Secondary | ICD-10-CM | POA: Diagnosis not present

## 2016-10-08 DIAGNOSIS — D0221 Carcinoma in situ of right bronchus and lung: Secondary | ICD-10-CM | POA: Diagnosis not present

## 2016-10-08 DIAGNOSIS — R1084 Generalized abdominal pain: Secondary | ICD-10-CM | POA: Insufficient documentation

## 2016-10-08 DIAGNOSIS — R918 Other nonspecific abnormal finding of lung field: Secondary | ICD-10-CM

## 2016-10-08 DIAGNOSIS — R911 Solitary pulmonary nodule: Secondary | ICD-10-CM | POA: Insufficient documentation

## 2016-10-08 LAB — COMPREHENSIVE METABOLIC PANEL
ALBUMIN: 3.2 g/dL — AB (ref 3.5–5.0)
ALK PHOS: 94 U/L (ref 38–126)
ALT: 11 U/L — ABNORMAL LOW (ref 14–54)
ANION GAP: 9 (ref 5–15)
AST: 17 U/L (ref 15–41)
BILIRUBIN TOTAL: 0.6 mg/dL (ref 0.3–1.2)
CO2: 24 mmol/L (ref 22–32)
Calcium: 8.8 mg/dL — ABNORMAL LOW (ref 8.9–10.3)
Chloride: 105 mmol/L (ref 101–111)
Creatinine, Ser: 0.57 mg/dL (ref 0.44–1.00)
GFR calc Af Amer: >60 mL/min (ref >60)
GFR calc non Af Amer: >60 mL/min (ref >60)
GLUCOSE: 89 mg/dL (ref 65–99)
POTASSIUM: 3.4 mmol/L — AB (ref 3.5–5.1)
SODIUM: 138 mmol/L (ref 135–145)
TOTAL PROTEIN: 6.9 g/dL (ref 6.5–8.1)

## 2016-10-08 LAB — CBC WITH DIFFERENTIAL/PLATELET
BASOS ABS: 0 10*3/uL (ref 0.0–0.1)
BASOS PCT: 0 %
EOS ABS: 0 10*3/uL (ref 0.0–0.7)
Eosinophils Relative: 1 %
HEMATOCRIT: 28.5 % — AB (ref 36.0–46.0)
HEMOGLOBIN: 9.3 g/dL — AB (ref 12.0–15.0)
Lymphocytes Relative: 13 %
Lymphs Abs: 0.6 10*3/uL — ABNORMAL LOW (ref 0.7–4.0)
MCH: 28.9 pg (ref 26.0–34.0)
MCHC: 32.6 g/dL (ref 30.0–36.0)
MCV: 88.5 fL (ref 78.0–100.0)
Monocytes Absolute: 0.5 10*3/uL (ref 0.1–1.0)
Monocytes Relative: 10 %
NEUTROS PCT: 76 %
Neutro Abs: 3.7 10*3/uL (ref 1.7–7.7)
Platelets: 375 10*3/uL (ref 150–400)
RBC: 3.22 MIL/uL — ABNORMAL LOW (ref 3.87–5.11)
RDW: 15.8 % — AB (ref 11.5–15.5)
WBC: 4.9 10*3/uL (ref 4.0–10.5)

## 2016-10-08 LAB — LIPASE, BLOOD: Lipase: 20 U/L (ref 11–51)

## 2016-10-08 LAB — HCG, SERUM, QUALITATIVE: Preg, Serum: NEGATIVE

## 2016-10-08 LAB — I-STAT CG4 LACTIC ACID, ED: LACTIC ACID, VENOUS: 0.82 mmol/L (ref 0.5–1.9)

## 2016-10-08 LAB — TROPONIN I

## 2016-10-08 MED ORDER — ONDANSETRON HCL 4 MG/2ML IJ SOLN
4.0000 mg | Freq: Once | INTRAMUSCULAR | Status: AC
  Administered 2016-10-08: 4 mg via INTRAVENOUS
  Filled 2016-10-08: qty 2

## 2016-10-08 MED ORDER — MORPHINE SULFATE (PF) 4 MG/ML IV SOLN
6.0000 mg | Freq: Once | INTRAVENOUS | Status: AC
  Administered 2016-10-08: 6 mg via INTRAVENOUS
  Filled 2016-10-08: qty 2

## 2016-10-08 MED ORDER — IOPAMIDOL (ISOVUE-370) INJECTION 76%
100.0000 mL | Freq: Once | INTRAVENOUS | Status: AC | PRN
  Administered 2016-10-08: 100 mL via INTRAVENOUS

## 2016-10-08 MED ORDER — METOCLOPRAMIDE HCL 10 MG PO TABS: 10.0000 mg | ORAL_TABLET | Freq: Four times a day (QID) | ORAL | 0 refills | Status: AC

## 2016-10-08 MED ORDER — HYDROMORPHONE HCL 1 MG/ML IJ SOLN
1.0000 mg | Freq: Once | INTRAMUSCULAR | Status: AC
  Administered 2016-10-08: 1 mg via INTRAVENOUS
  Filled 2016-10-08: qty 1

## 2016-10-08 MED ORDER — SODIUM CHLORIDE 0.9 % IV BOLUS (SEPSIS)
1000.0000 mL | Freq: Once | INTRAVENOUS | Status: AC
  Administered 2016-10-08: 1000 mL via INTRAVENOUS

## 2016-10-08 NOTE — ED Triage Notes (Signed)
Chest and abd pain to right side this am. Worse with movement or cough

## 2016-10-08 NOTE — ED Notes (Signed)
Pt requesting pain and nausea meds. PA made aware.

## 2016-10-08 NOTE — Discharge Instructions (Signed)
Please read and follow all provided instructions.  Your diagnoses today include:  1. Chest pain, unspecified type   2. Pulmonary nodules     Tests performed today include: Vital signs. See below for your results today.   Medications prescribed:  Take as prescribed   Home care instructions:  Follow any educational materials contained in this packet.  Follow-up instructions: Please follow-up with your primary care provider for further evaluation of symptoms and treatment   Return instructions:  Please return to the Emergency Department if you do not get better, if you get worse, or new symptoms OR  - Fever (temperature greater than 101.13F)  - Bleeding that does not stop with holding pressure to the area    -Severe pain (please note that you may be more sore the day after your accident)  - Chest Pain  - Difficulty breathing  - Severe nausea or vomiting  - Inability to tolerate food and liquids  - Passing out  - Skin becoming red around your wounds  - Change in mental status (confusion or lethargy)  - New numbness or weakness    Please return if you have any other emergent concerns.  Additional Information:  Your vital signs today were: BP 131/64    Pulse (!) 116    Temp 98.3 F (36.8 C) (Oral)    Resp (!) 23    Ht 5\' 7"  (1.702 m)    Wt 96.2 kg (212 lb)    LMP  (LMP Unknown)    SpO2 94%    BMI 33.20 kg/m  If your blood pressure (BP) was elevated above 135/85 this visit, please have this repeated by your doctor within one month. ---------------

## 2016-10-08 NOTE — ED Provider Notes (Signed)
[facility] DE[facility] Provider Note   CSN: [id number] Arrival date & time: 10/08/16  1107     History   Chief Complaint Chief Complaint  Patient presents with  . Abdominal Pain  . Chest Pain    HPI Kenneth Lax is a 36 y.o. female.  HPI  36 y.o. 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 Pt states occurred at rest and has been a constant sensation on the right side of chest. States pain feels like it radiates down to abdomen on right side. Notes worsening pain with breathing. Rates pain 10/10. Notes coughing consistently without hemoptysis. No hx PE, but does note hx DVT. Pt was on Lovenox, but stopped x 1 week ago due to not wanting to take medications anymore. Denies fevers. No N/V/D. No other symptoms noted.    Seen at [facility] ED for same on 10-07-16 with exact symptom presentation. Noted cough x 1 month that is non productive. PT with pleural ex catheter with 158mL fluid drained. .   CTA on 09-28-16 negative for PE at [facility] ED  Past Medical History:  Diagnosis Date  . Cancer ([place])    bil lung mets from breast  . Chronic anemia   . Chronic chest pain   . Esophagitis   . Hypertension   . Migraines   . MRSA (methicillin resistant Staphylococcus aureus)   . Pneumonia   . Renal disorder   . Renal insufficiency     There are no active problems to display for this patient.   Past Surgical History:  Procedure Laterality Date  . CESAREAN SECTION    . CHOLECYSTECTOMY    . MASTECTOMY Bilateral     OB History    No data available       Home Medications    Prior to Admission medications   Medication Sig Start Date End Date Taking? Authorizing Provider  busPIRone (BUSPAR) 5 MG tablet Take 5 mg by mouth 3 (three) times daily.    [provider]  HYDROcodone-acetaminophen (NORCO/VICODIN) 5-325 MG tablet Take 1 tablet by mouth every  6 (six) hours as needed for moderate pain.    [provider]  ondansetron (ZOFRAN ODT) 4 MG disintegrating tablet Take 1 tablet (4 mg total) by mouth every 8 (eight) hours as needed for nausea or vomiting. 09/14/16   Ward, Ozella Almond, PA-C  zolpidem (AMBIEN) 10 MG tablet Take 10 mg by mouth at bedtime as needed for sleep.    [provider]    Family History No family history on file.  Social History Social History  Substance Use Topics  . Smoking status: Never Smoker  . Smokeless tobacco: Never Used  . Alcohol use No     Allergies   Percocet [oxycodone-acetaminophen]   Review of Systems Review of Systems ROS reviewed and all are negative for acute change except as noted in the HPI.  Physical Exam Updated Vital Signs BP (!) 119/91 (BP Location: Left Arm)   Pulse (!) 124   Temp 98.3 F (36.8 C) (Oral)   Resp 20   Ht 5\' 7"  (1.702 m)   Wt 96.2 kg (212 lb)   LMP  (LMP Unknown)   SpO2 94%   BMI 33.20 kg/m   Physical Exam  Constitutional: She is oriented to person, place, and time. Vital signs are normal. She appears well-developed and well-nourished.  HENT:  Head: Normocephalic and atraumatic.  Right Ear: Hearing normal.  Left Ear: Hearing normal.  Eyes: Conjunctivae and EOM are normal. Pupils are equal, round, and reactive to light.  Neck: Normal range of motion. Neck supple.  Cardiovascular: Regular rhythm, normal heart sounds and intact distal pulses.  Tachycardia present.   Pulmonary/Chest: Effort normal and breath sounds normal. She has no decreased breath sounds. She has no wheezes. She has no rhonchi. She has no rales.  Abdominal: Soft.  Musculoskeletal: Normal range of motion.  Neurological: She is alert and oriented to person, place, and time.  Skin: Skin is warm and dry.  Psychiatric: She has a normal mood and affect. Her speech is normal and behavior is normal. Thought content normal.  Nursing note and vitals reviewed.  ED Treatments /  Results  Labs (all labs ordered are listed, but only abnormal results are displayed) Labs Reviewed  CBC WITH DIFFERENTIAL/PLATELET - Abnormal; Notable for the following:       Result Value   RBC 3.22 (*)    Hemoglobin 9.3 (*)    HCT 28.5 (*)    RDW 15.8 (*)    Lymphs Abs 0.6 (*)    All other components within normal limits  COMPREHENSIVE METABOLIC PANEL - Abnormal; Notable for the following:    Potassium 3.4 (*)    BUN <5 (*)    Calcium 8.8 (*)    Albumin 3.2 (*)    ALT 11 (*)    All other components within normal limits  LIPASE, BLOOD  HCG, SERUM, QUALITATIVE  TROPONIN I  I-STAT CG4 LACTIC ACID, ED    EKG  EKG Interpretation  Date/Time:  Sunday October 08 2016 11:14:32 EDT Ventricular Rate:  125 PR Interval:  150 QRS Duration: 72 QT Interval:  314 QTC Calculation: 453 R Axis:   93 Text Interpretation:  Sinus tachycardia Possible Left atrial enlargement Rightward axis Borderline ECG When compared to prior, new tachycardia. No STEMI Confirmed by Antony Blackbird [phone]) on 10/08/2016 11:32:16 AM       Radiology Ct Angio Chest Pe W And/or Wo Contrast  Result Date: 10/08/2016 CLINICAL DATA:  Chest pain.  Recent DVT. EXAM: CT ANGIOGRAPHY CHEST WITH CONTRAST TECHNIQUE: Multidetector CT imaging of the chest was performed using the standard protocol during bolus administration of intravenous contrast. Multiplanar CT image reconstructions and MIPs were obtained to evaluate the vascular anatomy. CONTRAST:  100 cc Isovue 370 IV COMPARISON:  09/21/2016 FINDINGS: Cardiovascular: Coronary artery calcifications in the left anterior descending coronary artery. Aorta is normal caliber. No filling defects in the pulmonary arteries to suggest pulmonary emboli. Mediastinum/Nodes: Edema throughout the mediastinum making visualization of individual lymph nodes difficult. No visible hilar adenopathy or axillary adenopathy. Lungs/Pleura: Interval placement of right PleurX catheter with decreasing right  pleural effusion. The right pleural effusion is loculated throughout the right hemithorax. Numerous nodular airspace opacities noted in the lungs bilaterally this is unchanged since prior study. This is most compatible with metastases. Upper Abdomen: Imaging into the upper abdomen shows no acute findings. Musculoskeletal: Chest wall soft tissues are unremarkable. no acute bony abnormality. Review of the MIP images confirms the above findings. IMPRESSION: Interval placement of right PleurX catheter with decreasing right effusion. Remaining moderate right effusion is loculated throughout the right hemithorax. Numerous bilateral solid and ground-glass pulmonary nodules throughout both lungs similar to recent study compatible with metastases. Mild cardiomegaly.  Coronary artery disease. No evidence of pulmonary embolus. Electronically Signed   By: Rolm Baptise M.D.  On: 10/08/2016 13:51    Procedures Procedures (including critical care time)  Medications Ordered in ED Medications - No data to display   Initial Impression / Assessment and Plan / ED Course  I have reviewed the triage vital signs and the nursing notes.  Pertinent labs & imaging results that were available during my care of the patient were reviewed by me and considered in my medical decision making (see chart for details).  Final Clinical Impressions(s) / ED Diagnoses  {I have reviewed and evaluated the relevant laboratory values. {I have reviewed and evaluated the relevant imaging studies. {I have interpreted the relevant EKG. {I have reviewed the relevant previous healthcare records.  {I obtained HPI from historian. {Patient discussed with supervising physician.  ED Course:  Assessment: Pt is a 36 y.o. 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 Pt states occurred at rest and has been a constant sensation on the right side of chest.  States pain feels like it radiates down to abdomen on right side. Notes worsening pain with breathing. Rates pain 10/10. Notes coughing consistently without hemoptysis. No hx PE, but does note hx DVT. Pt was on Lovenox, but stopped x 1 week ago due to not wanting to take medications anymore. Denies fevers. No N/V/D. No other symptoms noted.    Seen at [facility] ED for same on 10-07-16 with exact symptom presentation. Noted cough x 1 month that is non productive. PT with pleural ex catheter with 151mL fluid drained. .   CTA on 09-28-16 negative for PE at [facility] ED  On exam, pt in NAD. Nontoxic/nonseptic appearing. VS with tachycardia. Normotensive. Afebrile. Lungs CTA. Heart RRR. Abdomen nontender soft. iStat Lactic negative. CBC unremarkable. CMP unremarkable. Lipase negative. Trop negative. EKG sinus tachycardia. Appears baseline per previous ED visits. CT Angio negative for PE. Given analgesia in ED.   Plan is to DC home with follow up to PCP. Has follow up on Monday for Pleurex drain removal. Hemodynamically stable. O2 saturations 96% on RA.  At time of discharge, Patient is in no acute distress. Vital Signs are stable. Patient is able to ambulate. Patient able to tolerate PO.   Disposition/Plan:  DC Home Additional Verbal discharge instructions given and discussed with patient.  Pt Instructed to f/u with PCP in the next week for evaluation and treatment of symptoms. Return precautions given Pt acknowledges and agrees with plan  Supervising Physician Tegeler, Gwenyth Allegra, *  Final diagnoses:  Chest pain, unspecified type  Pulmonary nodules    New Prescriptions New Prescriptions   No medications on file     Shary Decamp, PA-C 10/08/16 [place], Gwenyth Allegra, MD 10/08/16 [phone]

## 2016-10-13 ENCOUNTER — Encounter (HOSPITAL_BASED_OUTPATIENT_CLINIC_OR_DEPARTMENT_OTHER): Payer: Self-pay | Admitting: Emergency Medicine

## 2016-10-13 ENCOUNTER — Emergency Department (HOSPITAL_BASED_OUTPATIENT_CLINIC_OR_DEPARTMENT_OTHER): Payer: Medicare Other

## 2016-10-13 ENCOUNTER — Emergency Department (HOSPITAL_BASED_OUTPATIENT_CLINIC_OR_DEPARTMENT_OTHER)
Admission: EM | Admit: 2016-10-13 | Discharge: 2016-10-13 | Disposition: A | Discharge status: Home / Self Care | Payer: Medicare Other | Attending: Emergency Medicine | Admitting: Emergency Medicine

## 2016-10-13 DIAGNOSIS — R072 Precordial pain: Secondary | ICD-10-CM | POA: Diagnosis not present

## 2016-10-13 DIAGNOSIS — I10 Essential (primary) hypertension: Secondary | ICD-10-CM | POA: Diagnosis not present

## 2016-10-13 DIAGNOSIS — Z853 Personal history of malignant neoplasm of breast: Secondary | ICD-10-CM | POA: Insufficient documentation

## 2016-10-13 DIAGNOSIS — R0602 Shortness of breath: Secondary | ICD-10-CM | POA: Insufficient documentation

## 2016-10-13 LAB — CBC WITH DIFFERENTIAL/PLATELET
Basophils Absolute: 0 10*3/uL (ref 0.0–0.1)
Basophils Relative: 0 %
EOS ABS: 0 10*3/uL (ref 0.0–0.7)
Eosinophils Relative: 1 %
HCT: 27.8 % — ABNORMAL LOW (ref 36.0–46.0)
HEMOGLOBIN: 9.2 g/dL — AB (ref 12.0–15.0)
LYMPHS ABS: 0.9 10*3/uL (ref 0.7–4.0)
LYMPHS PCT: 15 %
MCH: 29.3 pg (ref 26.0–34.0)
MCHC: 33.1 g/dL (ref 30.0–36.0)
MCV: 88.5 fL (ref 78.0–100.0)
Monocytes Absolute: 1.1 10*3/uL — ABNORMAL HIGH (ref 0.1–1.0)
Monocytes Relative: 19 %
NEUTROS ABS: 3.7 10*3/uL (ref 1.7–7.7)
NEUTROS PCT: 65 %
Platelets: 348 10*3/uL (ref 150–400)
RBC: 3.14 MIL/uL — AB (ref 3.87–5.11)
RDW: 16 % — ABNORMAL HIGH (ref 11.5–15.5)
WBC: 5.6 10*3/uL (ref 4.0–10.5)

## 2016-10-13 LAB — COMPREHENSIVE METABOLIC PANEL
ALT: 9 U/L — ABNORMAL LOW (ref 14–54)
ANION GAP: 10 (ref 5–15)
AST: 14 U/L — ABNORMAL LOW (ref 15–41)
Albumin: 3.2 g/dL — ABNORMAL LOW (ref 3.5–5.0)
Alkaline Phosphatase: 92 U/L (ref 38–126)
BUN: 5 mg/dL — ABNORMAL LOW (ref 6–20)
CHLORIDE: 103 mmol/L (ref 101–111)
CO2: 25 mmol/L (ref 22–32)
Calcium: 8.2 mg/dL — ABNORMAL LOW (ref 8.9–10.3)
Creatinine, Ser: 0.64 mg/dL (ref 0.44–1.00)
GFR calc non Af Amer: >60 mL/min (ref >60)
Glucose, Bld: 95 mg/dL (ref 65–99)
POTASSIUM: 3.2 mmol/L — AB (ref 3.5–5.1)
SODIUM: 138 mmol/L (ref 135–145)
Total Bilirubin: 0.5 mg/dL (ref 0.3–1.2)
Total Protein: 6.8 g/dL (ref 6.5–8.1)

## 2016-10-13 LAB — I-STAT CG4 LACTIC ACID, ED: Lactic Acid, Venous: 0.99 mmol/L (ref 0.5–1.9)

## 2016-10-13 LAB — PROTIME-INR
INR: 1.11
PROTHROMBIN TIME: 14.4 s (ref 11.4–15.2)

## 2016-10-13 MED ORDER — HYDROCODONE-ACETAMINOPHEN 5-325 MG PO TABS
1.0000 | ORAL_TABLET | Freq: Once | ORAL | Status: AC
  Administered 2016-10-13: 1 via ORAL
  Filled 2016-10-13: qty 1

## 2016-10-13 MED ORDER — HEPARIN SOD (PORK) LOCK FLUSH 100 UNIT/ML IV SOLN
INTRAVENOUS | Status: AC
  Administered 2016-10-13: 500 [IU]
  Filled 2016-10-13: qty 5

## 2016-10-13 MED ORDER — MORPHINE SULFATE (PF) 4 MG/ML IV SOLN
4.0000 mg | Freq: Once | INTRAVENOUS | Status: AC
  Administered 2016-10-13: 4 mg via INTRAVENOUS
  Filled 2016-10-13: qty 1

## 2016-10-13 NOTE — ED Notes (Signed)
ED Provider at bedside. 

## 2016-10-13 NOTE — ED Triage Notes (Signed)
Patient reports shortness of breath since 7/4.  Reports cancer patient-last chemo 1 week ago.  Denies fevers.  Reports productive cough.

## 2016-10-13 NOTE — Discharge Instructions (Signed)
You were seen in the ED today with chest pain and difficulty breathing. Your lab work and chest x-ray are similar to recent prior values. Continue taking your pain medication at home and follow up with your PCP and Oncologist to discuss further pain medication and plan for cancer treatment.

## 2016-10-13 NOTE — ED Provider Notes (Signed)
Emergency Department Provider Note   I have reviewed the triage vital signs and the nursing notes.   HISTORY  Chief Complaint Shortness of Breath   HPI Loretta Khan is a 36 y.o. 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 She reports chronic chest pain but states it's more severe than normal. She describes symptoms worsening over the past 2 days. She denies fever or chills. She does have some coughing. No abdominal discomfort. She has some nausea but denies vomiting or diarrhea. Her last chemotherapy was 1 week ago when she continues to follow with oncology at [facility] in [facility]. She reports tightness in her chest that radiates from the left axilla across the entire chest with some associated dyspnea. Dyspnea worse with exertion.    Past Medical History:  Diagnosis Date  . Cancer ([place])    bil lung mets from breast  . Chronic anemia   . Chronic chest pain   . Esophagitis   . Hypertension   . Migraines   . MRSA (methicillin resistant Staphylococcus aureus)   . Pneumonia   . Renal disorder   . Renal insufficiency     There are no active problems to display for this patient.   Past Surgical History:  Procedure Laterality Date  . CESAREAN SECTION    . CHOLECYSTECTOMY    . MASTECTOMY Bilateral     Current Outpatient Rx  . Order #: [id number] Class: Historical Med  . Order #: [id number] Class: Historical Med  . Order #: [id number] Class: Print  . Order #: [id number] Class: Print  . Order #: [id number] Class: Historical Med    Allergies Percocet [oxycodone-acetaminophen]  History reviewed. No pertinent family history.  Social History Social History  Substance Use Topics  . Smoking status: Never Smoker  . Smokeless tobacco: Never Used  . Alcohol use No    Review of Systems  Constitutional: No fever/chills Eyes: No visual  changes. ENT: No sore throat. Cardiovascular: Positive chest pain. Respiratory: Positive shortness of breath. Gastrointestinal: No abdominal pain.  No nausea, no vomiting.  No diarrhea.  No constipation. Genitourinary: Negative for dysuria. Musculoskeletal: Negative for back pain. Skin: Negative for rash. Neurological: Negative for headaches, focal weakness or numbness.  10-point ROS otherwise negative.  ____________________________________________   PHYSICAL EXAM:  VITAL SIGNS: ED Triage Vitals  Enc Vitals Group     BP 10/13/16 1808 (!) 143/94     Pulse Rate 10/13/16 1808 (!) 105     Resp 10/13/16 1808 (!) 22     Temp 10/13/16 1808 98.6 F (37 C)     Temp Source 10/13/16 1808 Oral     SpO2 10/13/16 1808 95 %     Weight 10/13/16 1806 212 lb (96.2 kg)     Height 10/13/16 1806 5\' 7"  (1.702 m)     Pain Score 10/13/16 1806 10   Constitutional: Alert and oriented. Chronically ill-appearing and intermittently tearful.  Eyes: Conjunctivae are normal.  Head: Atraumatic. Nose: No congestion/rhinnorhea. Mouth/Throat: Mucous membranes are dry.  Neck: No stridor.   Cardiovascular: Sinus tachycardia. Good peripheral circulation. Grossly normal heart sounds.   Respiratory: Increased respiratory effort.  No retractions. Lungs CTAB. Gastrointestinal: Soft and nontender. No distention.  Musculoskeletal: No lower extremity tenderness nor edema. No gross deformities of extremities. Neurologic:  Normal speech and language. No gross focal neurologic deficits are appreciated.  Skin:  Skin is warm, dry and  intact. No rash noted.   ____________________________________________   LABS (all labs ordered are listed, but only abnormal results are displayed)  Labs Reviewed  COMPREHENSIVE METABOLIC PANEL - Abnormal; Notable for the following:       Result Value   Potassium 3.2 (*)    BUN 5 (*)    Calcium 8.2 (*)    Albumin 3.2 (*)    AST 14 (*)    ALT 9 (*)    All other components within  normal limits  CBC WITH DIFFERENTIAL/PLATELET - Abnormal; Notable for the following:    RBC 3.14 (*)    Hemoglobin 9.2 (*)    HCT 27.8 (*)    RDW 16.0 (*)    Monocytes Absolute 1.1 (*)    All other components within normal limits  CULTURE, BLOOD (ROUTINE X 2)  PROTIME-INR  I-STAT CG4 LACTIC ACID, ED   ____________________________________________  EKG   EKG Interpretation  Date/Time:  Friday October 13 2016 18:09:22 EDT Ventricular Rate:  104 PR Interval:    QRS Duration: 87 QT Interval:  358 QTC Calculation: 471 R Axis:   61 Text Interpretation:  Sinus tachycardia Probable left atrial enlargement Borderline T abnormalities, anterior leads No STEMI. Similar to prior.  Confirmed by Nanda Quinton [phone]) on 10/13/2016 6:13:08 PM Also confirmed by Nanda Quinton [phone]), editor Drema Pry [phone])  on 10/14/2016 9:16:29 AM       ____________________________________________  RADIOLOGY  Dg Chest 2 View  Result Date: 10/13/2016 CLINICAL DATA:  Shortness of breath. Cough. Nausea. Symptoms for 3 days. EXAM: CHEST  2 VIEW COMPARISON:  Radiographs and chest CT 4 days prior 10/09/2016 FINDINGS: Unchanged position of right central line tip in the mid SVC. Right PleurX catheter in place with tip at the apex. Unchanged partially loculated right pleural effusion and opacities in the right lung base. Heterogeneous opacities in the left lower lobe likely combination of nodules on bronchial thickening. Unchanged heart size and mediastinal contours. No pneumothorax. IMPRESSION: Stable appearance of the chest. Partially loculated right pleural effusion with right PleurX catheter in place. Heterogeneous lung base opacities consistent with nodules and bronchial thickening as seen on recent CT. Electronically Signed   By: Jeb Levering M.D.   On: 10/13/2016 19:03    ____________________________________________   PROCEDURES  Procedure(s) performed:    Procedures  None ____________________________________________   INITIAL IMPRESSION / ASSESSMENT AND PLAN / ED COURSE  Pertinent labs & imaging results that were available during my care of the patient were reviewed by me and considered in my medical decision making (see chart for details).  Patient presents to the emergency department for evaluation of chronic chest pain worsening over the past 2 days. She has stage IV breast cancer and is followed by oncology at [facility] in [facility]. She was evaluated in this emergency department 5 days ago with a similar presentation. CT scan shows loculated pleural effusion and concern for prostatic disease. No pulmonary embolism. In review of Care Everywhere she was evaluated at the [facility] yesterday where she was prescribed a 5 day course of MS Contin and MSIR. The patient reports being out of pain medication at this time. She is tearful on my exam. Patient was also evaluated at the [facility] emergency department today but left after the discussion turned to Hospice. She states that she is not ready to have that discussion. She reports calling her oncology team and was referred back to the emergency department for pain control. This is  a dictation appears to be chronic in nature. Patient does have sinus tachycardia but normal oxygen saturation and blood pressure. Plan for pain medication here while labs are resulting. We will repeat a chest x-ray and reassess.   Unchanged CXR. Unremarkable labs. Patient feeling better after Morphine. Reports to me that she has some old pain medication at home. Advised that she take only as directed and follow up with physician who is managing her pain on Monday. No opiate medication given at discharge given recent Rx from outside ED.   At this time, I do not feel there is any life-threatening condition present. I have reviewed and discussed all results (EKG, imaging, lab, urine as appropriate), exam findings with  patient. I have reviewed nursing notes and appropriate previous records.  I feel the patient is safe to be discharged home without further emergent workup. Discussed usual and customary return precautions. Patient and family (if present) verbalize understanding and are comfortable with this plan.  Patient will follow-up with their primary care provider. If they do not have a primary care provider, information for follow-up has been provided to them. All questions have been answered.  ____________________________________________  FINAL CLINICAL IMPRESSION(S) / ED DIAGNOSES  Final diagnoses:  Precordial chest pain  SOB (shortness of breath)     MEDICATIONS GIVEN DURING THIS VISIT:  Medications  morphine 4 MG/ML injection 4 mg (4 mg Intravenous Given 10/13/16 1835)  HYDROcodone-acetaminophen (NORCO/VICODIN) 5-325 MG per tablet 1 tablet (1 tablet Oral Given 10/13/16 1954)  heparin lock flush 100 UNIT/ML injection (500 Units  Given 10/13/16 1956)     NEW OUTPATIENT MEDICATIONS STARTED DURING THIS VISIT:  None   Note:  This document was prepared using Dragon voice recognition software and may include unintentional dictation errors.  Nanda Quinton, MD Emergency Medicine    Long, [facility], MD 10/14/16 [phone]

## 2016-10-18 LAB — CULTURE, BLOOD (ROUTINE X 2)
Culture: NO GROWTH
Special Requests: ADEQUATE

## 2016-11-08 DEATH — deceased

## 2018-10-01 IMAGING — CT CT ANGIO CHEST
2 of 8 series · 18 of 36 positions shown · IV contrast (isovue)
Comparison: 09/21/2016

CLINICAL DATA: Chest pain.  Recent DVT.

EXAM:
CT ANGIOGRAPHY CHEST WITH CONTRAST
TECHNIQUE: Multidetector CT imaging of the chest was performed using the
standard protocol during bolus administration of intravenous
contrast. Multiplanar CT image reconstructions and MIPs were
obtained to evaluate the vascular anatomy.
CONTRAST:  100 cc Isovue 370 IV

[Series 6: pe thins · axial · 0.67mm/px · z∈[-302,-33]mm · 17 of 301 slices shown]
[im 16/301  lung]
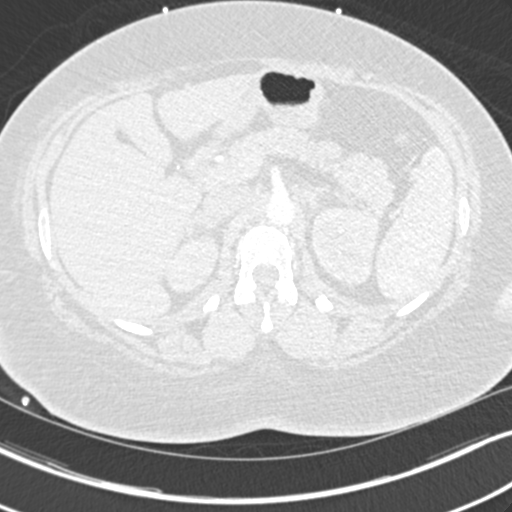
[im 32/301  mediastinal]
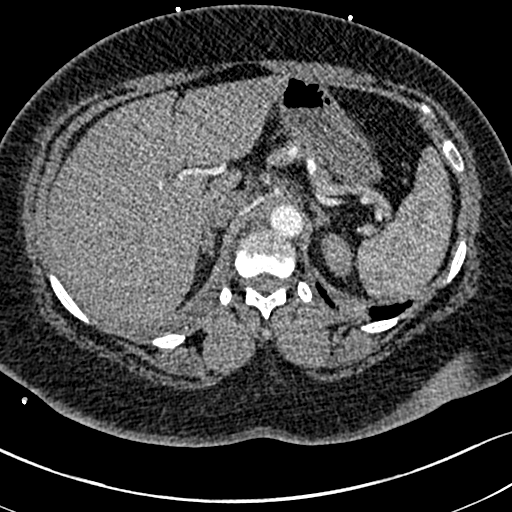
[im 48/301  lung]
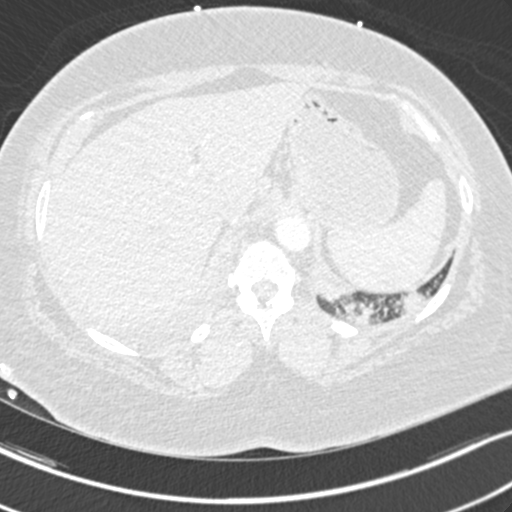
[im 64/301  mediastinal]
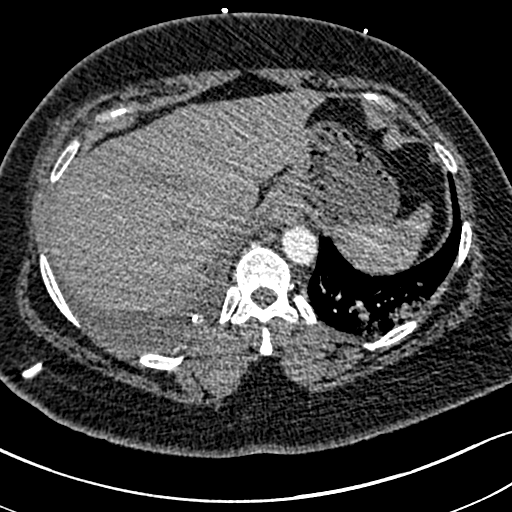
[im 79/301  lung]
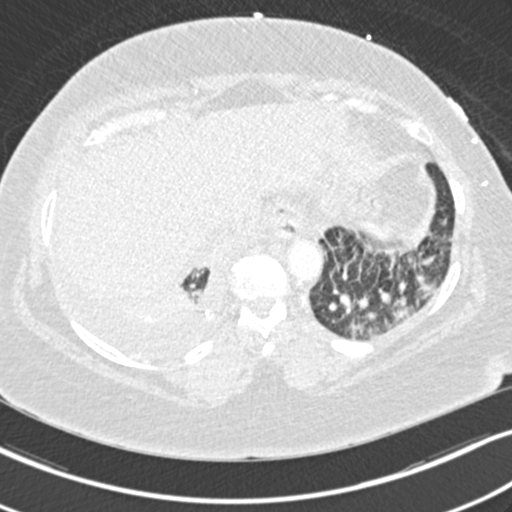
[im 95/301  mediastinal]
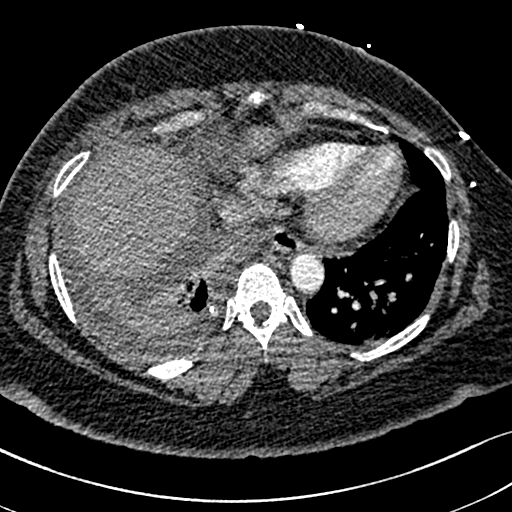
[im 111/301  lung]
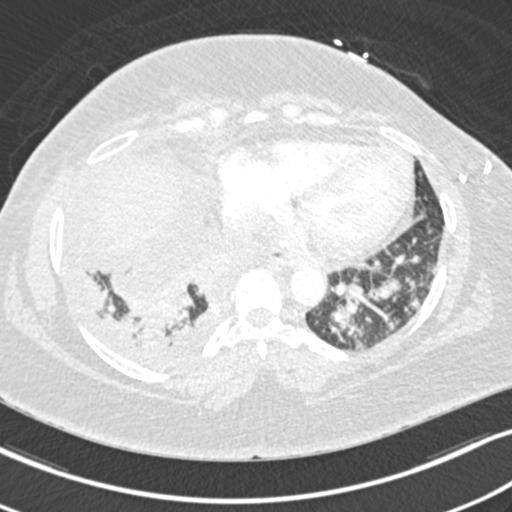
[im 127/301  mediastinal]
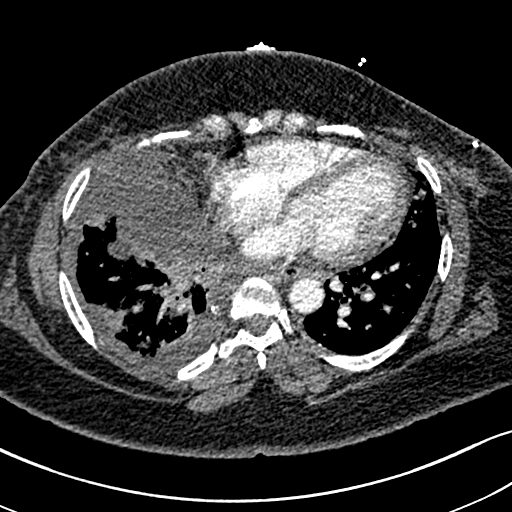
[im 158/301  lung]
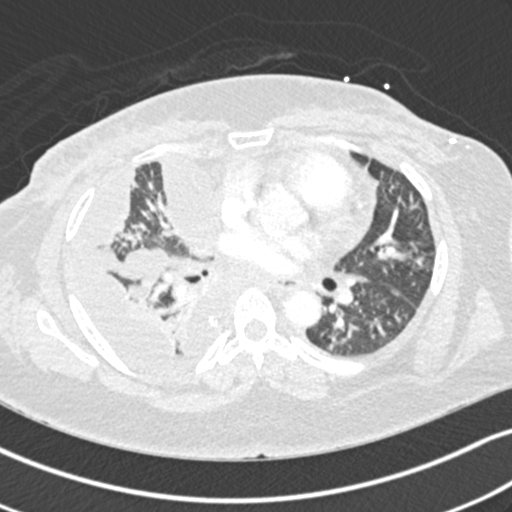
[im 174/301  mediastinal]
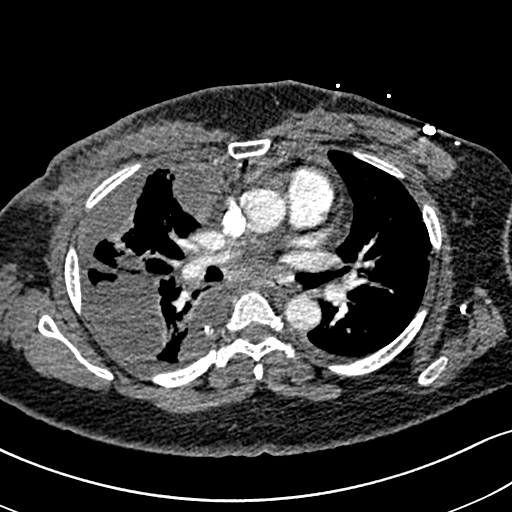
[im 190/301  lung]
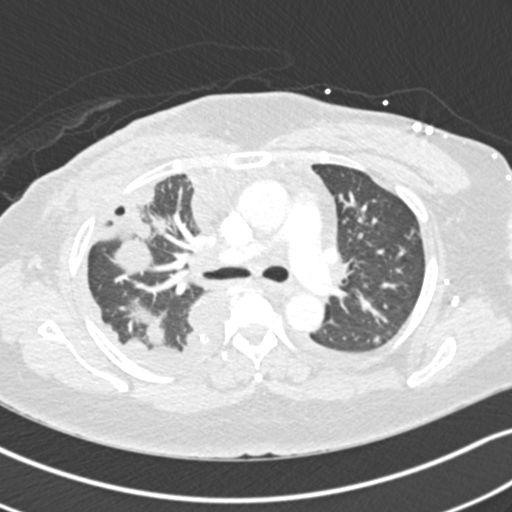
[im 206/301  mediastinal]
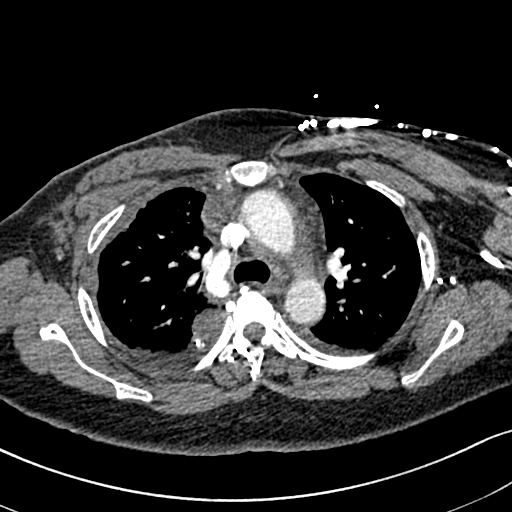
[im 222/301  lung]
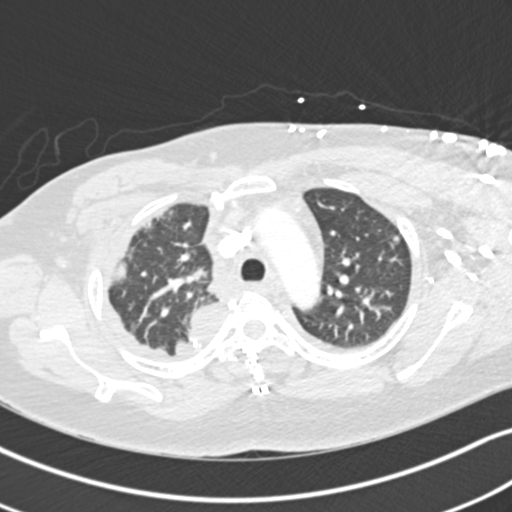
[im 237/301  mediastinal]
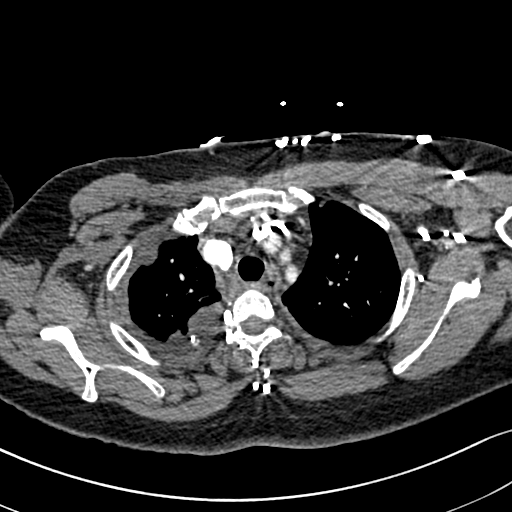
[im 253/301  lung]
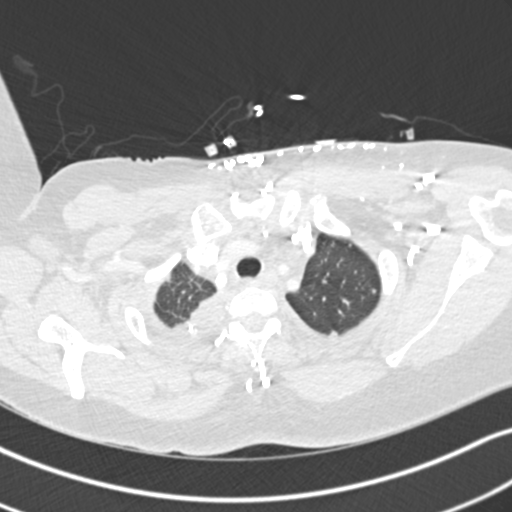
[im 269/301  mediastinal]
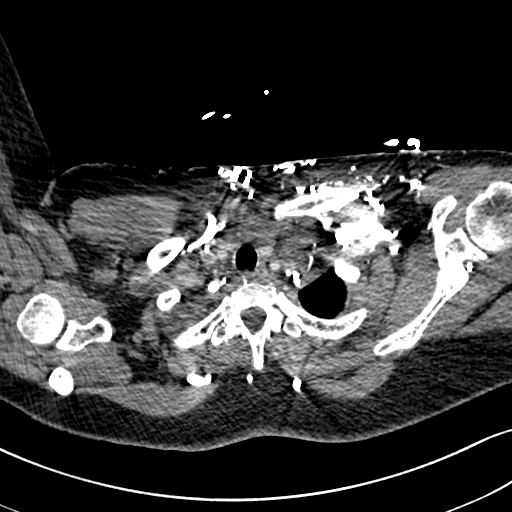
[im 285/301  lung]
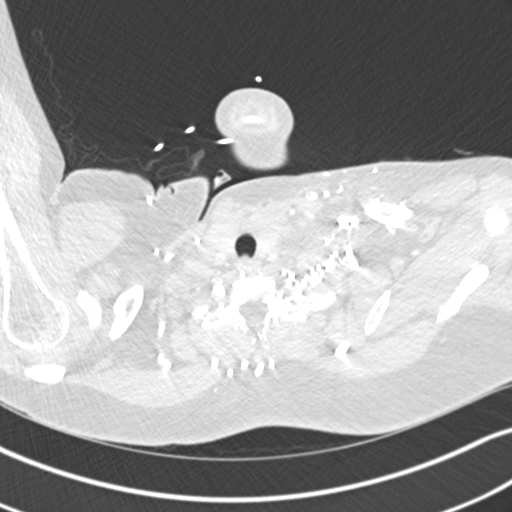

[Series 7: pe coronal mpr · coronal · 0.59mm/px · 1 of 127 slices shown]
[im 64/127  mediastinal]
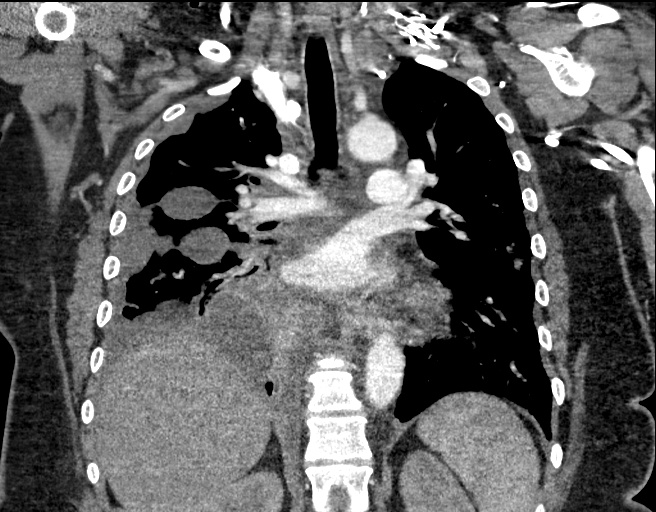

[18 of 36 positions shown; findings below may reference images not displayed]

FINDINGS: Cardiovascular: Coronary artery calcifications in the left anterior
descending coronary artery. Aorta is normal caliber. No filling
defects in the pulmonary arteries to suggest pulmonary emboli.

Mediastinum/Nodes: Edema throughout the mediastinum making
visualization of individual lymph nodes difficult. No visible hilar
adenopathy or axillary adenopathy.

Lungs/Pleura: Interval placement of right PleurX catheter with
decreasing right pleural effusion. The right pleural effusion is
loculated throughout the right hemithorax. Numerous nodular airspace
opacities noted in the lungs bilaterally this is unchanged since
prior study. This is most compatible with metastases.

Upper Abdomen: Imaging into the upper abdomen shows no acute
findings.

Musculoskeletal: Chest wall soft tissues are unremarkable. no acute
bony abnormality.

Review of the MIP images confirms the above findings.
IMPRESSION: Interval placement of right PleurX catheter with decreasing right
effusion. Remaining moderate right effusion is loculated throughout
the right hemithorax.

Numerous bilateral solid and ground-glass pulmonary nodules
throughout both lungs similar to recent study compatible with
metastases.

Mild cardiomegaly.  Coronary artery disease.

No evidence of pulmonary embolus.

## 2018-10-06 IMAGING — DX DG CHEST 2V
2 series · 2 of 2 positions shown · non-contrast
Comparison: Radiographs and chest CT 4 days prior 10/09/2016

CLINICAL DATA: Shortness of breath. Cough. Nausea. Symptoms for 3
days.

EXAM:
CHEST  2 VIEW

[chest lat]
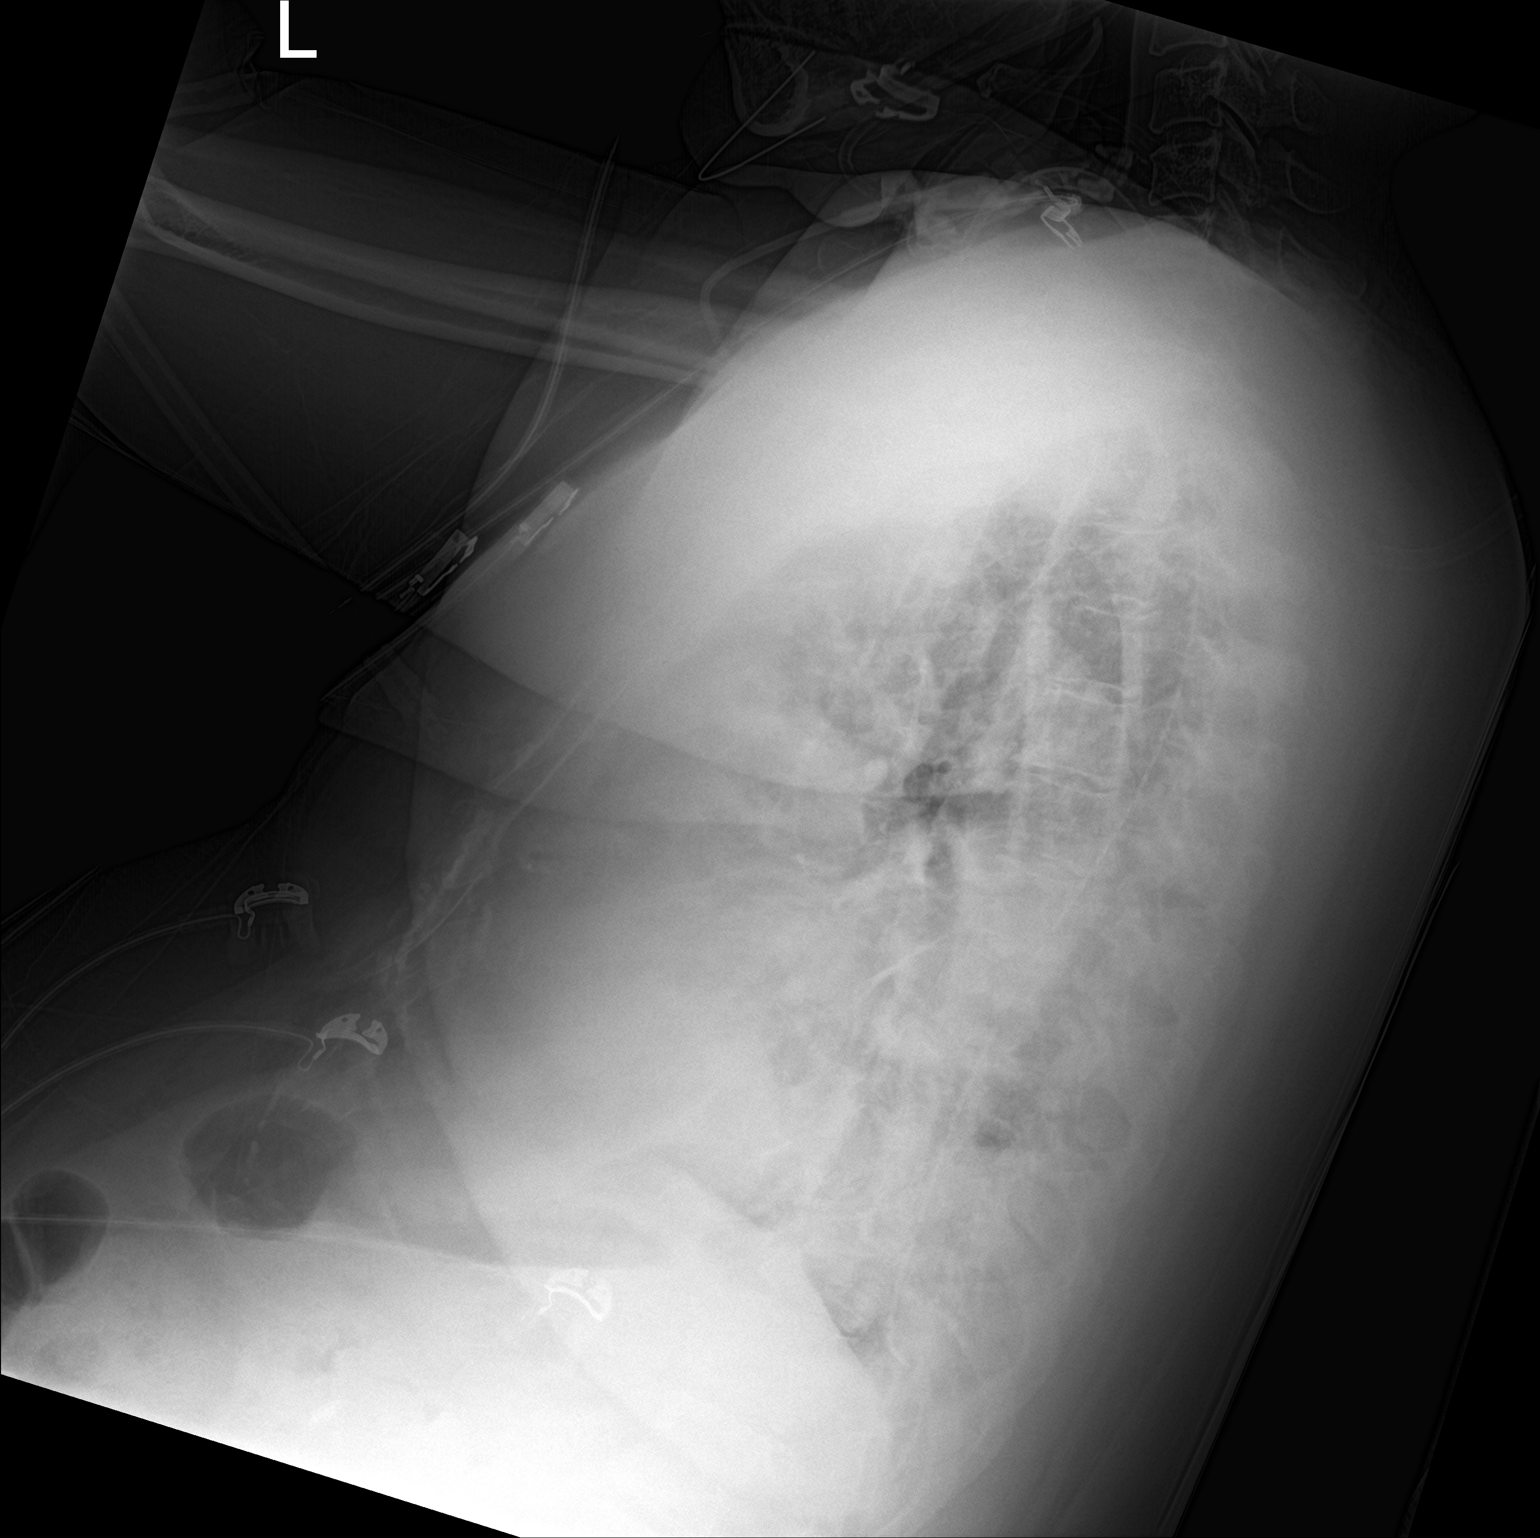

[chest ap]
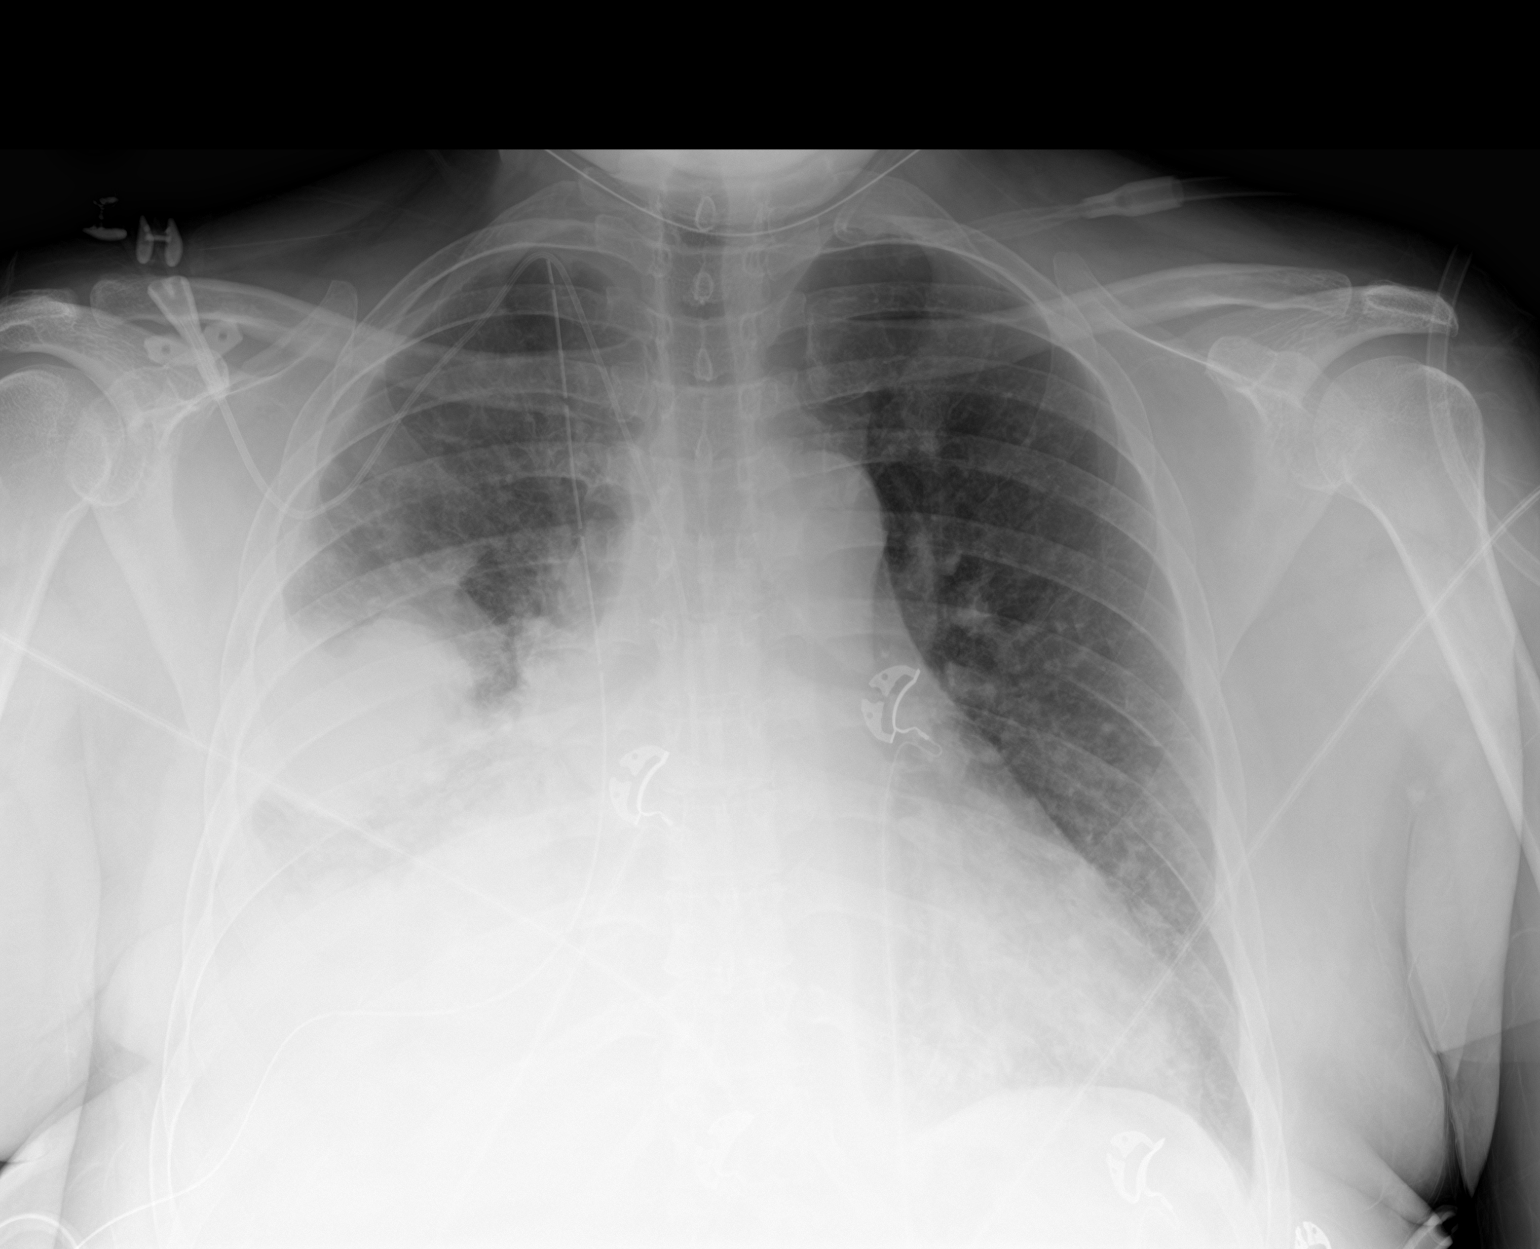

[2 of 2 positions shown; findings below may reference images not displayed]

FINDINGS: Unchanged position of right central line tip in the mid SVC. Right
PleurX catheter in place with tip at the apex. Unchanged partially
loculated right pleural effusion and opacities in the right lung
base. Heterogeneous opacities in the left lower lobe likely
combination of nodules on bronchial thickening. Unchanged heart size
and mediastinal contours. No pneumothorax.
IMPRESSION: Stable appearance of the chest. Partially loculated right pleural
effusion with right PleurX catheter in place. Heterogeneous lung
base opacities consistent with nodules and bronchial thickening as
seen on recent CT.
